# Patient Record
Sex: Female | Born: 1980
Health system: Southern US, Community
[De-identification: ages and names within clinical notes are randomized; demographics above are authoritative.]

## PROBLEM LIST (undated history)

## (undated) DIAGNOSIS — E119 Type 2 diabetes mellitus without complications: Secondary | ICD-10-CM

## (undated) DIAGNOSIS — J302 Other seasonal allergic rhinitis: Secondary | ICD-10-CM

## (undated) DIAGNOSIS — F419 Anxiety disorder, unspecified: Secondary | ICD-10-CM

## (undated) DIAGNOSIS — F32A Depression, unspecified: Secondary | ICD-10-CM

## (undated) HISTORY — PX: INGUINAL HERNIA REPAIR: SHX194

## (undated) HISTORY — PX: WISDOM TOOTH EXTRACTION: SHX21

---

## 2001-03-20 ENCOUNTER — Encounter: Admission: RE | Admit: 2001-03-20 | Discharge: 2001-06-18 | Payer: Self-pay | Admitting: Endocrinology

## 2007-08-24 ENCOUNTER — Inpatient Hospital Stay (HOSPITAL_COMMUNITY): Admission: AD | Admit: 2007-08-24 | Discharge: 2007-08-26 | Payer: Self-pay | Admitting: Obstetrics & Gynecology

## 2009-05-25 ENCOUNTER — Ambulatory Visit: Payer: Self-pay | Admitting: Family Medicine

## 2009-09-19 ENCOUNTER — Ambulatory Visit (HOSPITAL_COMMUNITY): Admission: RE | Admit: 2009-09-19 | Discharge: 2009-09-19 | Payer: Self-pay | Admitting: Obstetrics & Gynecology

## 2010-02-27 ENCOUNTER — Inpatient Hospital Stay (HOSPITAL_COMMUNITY): Admission: RE | Admit: 2010-02-27 | Discharge: 2010-03-01 | Payer: Self-pay | Admitting: Obstetrics & Gynecology

## 2010-06-23 LAB — CBC
HCT: 32.9 % — ABNORMAL LOW (ref 36.0–46.0)
HCT: 35.8 % — ABNORMAL LOW (ref 36.0–46.0)
Hemoglobin: 11.6 g/dL — ABNORMAL LOW (ref 12.0–15.0)
Hemoglobin: 12.6 g/dL (ref 12.0–15.0)
MCH: 32.1 pg (ref 26.0–34.0)
MCH: 32.5 pg (ref 26.0–34.0)
MCHC: 35.1 g/dL (ref 30.0–36.0)
MCHC: 35.2 g/dL (ref 30.0–36.0)
MCV: 91.4 fL (ref 78.0–100.0)
MCV: 92.3 fL (ref 78.0–100.0)
Platelets: 193 10*3/uL (ref 150–400)
Platelets: 213 10*3/uL (ref 150–400)
RBC: 3.56 MIL/uL — ABNORMAL LOW (ref 3.87–5.11)
RBC: 3.91 MIL/uL (ref 3.87–5.11)
RDW: 12.7 % (ref 11.5–15.5)
RDW: 12.8 % (ref 11.5–15.5)
WBC: 11 10*3/uL — ABNORMAL HIGH (ref 4.0–10.5)
WBC: 12.5 10*3/uL — ABNORMAL HIGH (ref 4.0–10.5)

## 2010-06-23 LAB — ABO/RH: ABO/RH(D): O POS

## 2010-06-23 LAB — RPR: RPR Ser Ql: NONREACTIVE

## 2010-06-23 LAB — GLUCOSE, RANDOM: Glucose, Bld: 109 mg/dL — ABNORMAL HIGH (ref 70–99)

## 2011-01-06 LAB — CBC
HCT: 34.7 — ABNORMAL LOW
Hemoglobin: 12
MCHC: 34.5
MCV: 91.1
Platelets: 214
RBC: 3.81 — ABNORMAL LOW
RDW: 13.3
WBC: 18.1 — ABNORMAL HIGH

## 2012-06-02 ENCOUNTER — Ambulatory Visit: Payer: Self-pay | Admitting: Surgery

## 2012-06-02 LAB — CBC WITH DIFFERENTIAL/PLATELET
Basophil #: 0.1 10*3/uL (ref 0.0–0.1)
Basophil %: 1.3 %
Eosinophil #: 0.4 10*3/uL (ref 0.0–0.7)
Eosinophil %: 6.9 %
HCT: 38.1 % (ref 35.0–47.0)
HGB: 12.9 g/dL (ref 12.0–16.0)
Lymphocyte #: 1.7 10*3/uL (ref 1.0–3.6)
Lymphocyte %: 27.4 %
MCH: 29.7 pg (ref 26.0–34.0)
MCHC: 34 g/dL (ref 32.0–36.0)
MCV: 87 fL (ref 80–100)
Monocyte #: 0.3 x10 3/mm (ref 0.2–0.9)
Monocyte %: 5 %
Neutrophil #: 3.7 10*3/uL (ref 1.4–6.5)
Neutrophil %: 59.4 %
Platelet: 221 10*3/uL (ref 150–440)
RBC: 4.36 10*6/uL (ref 3.80–5.20)
RDW: 12.3 % (ref 11.5–14.5)
WBC: 6.3 10*3/uL (ref 3.6–11.0)

## 2012-06-02 LAB — BASIC METABOLIC PANEL
Anion Gap: 5 — ABNORMAL LOW (ref 7–16)
BUN: 13 mg/dL (ref 7–18)
Calcium, Total: 9.1 mg/dL (ref 8.5–10.1)
Chloride: 105 mmol/L (ref 98–107)
Co2: 31 mmol/L (ref 21–32)
Creatinine: 1.01 mg/dL (ref 0.60–1.30)
EGFR (African American): >60
EGFR (Non-African Amer.): >60
Glucose: 118 mg/dL — ABNORMAL HIGH (ref 65–99)
Osmolality: 282 (ref 275–301)
Potassium: 3.7 mmol/L (ref 3.5–5.1)
Sodium: 141 mmol/L (ref 136–145)

## 2012-06-13 ENCOUNTER — Ambulatory Visit: Payer: Self-pay | Admitting: Surgery

## 2012-06-13 LAB — URINALYSIS, COMPLETE
Bacteria: NEGATIVE
Bilirubin,UR: NEGATIVE
Blood: NEGATIVE
Glucose,UR: NEGATIVE mg/dL (ref 0–75)
Ketone: NEGATIVE
Leukocyte Esterase: NEGATIVE
Nitrite: NEGATIVE
Ph: 7 (ref 4.5–8.0)
Protein: NEGATIVE
RBC,UR: NONE SEEN /HPF (ref 0–5)
Specific Gravity: 1.005 (ref 1.003–1.030)
WBC UR: NONE SEEN /HPF (ref 0–5)

## 2012-06-15 ENCOUNTER — Ambulatory Visit: Payer: Self-pay | Admitting: Surgery

## 2016-02-08 ENCOUNTER — Encounter: Payer: Self-pay | Admitting: Gynecology

## 2016-02-08 ENCOUNTER — Ambulatory Visit
Admission: EM | Admit: 2016-02-08 | Discharge: 2016-02-08 | Disposition: A | Discharge status: Home / Self Care | Payer: 59 | Attending: Family Medicine | Admitting: Family Medicine

## 2016-02-08 DIAGNOSIS — J209 Acute bronchitis, unspecified: Secondary | ICD-10-CM

## 2016-02-08 HISTORY — DX: Type 2 diabetes mellitus without complications: E11.9

## 2016-02-08 MED ORDER — BENZONATATE 100 MG PO CAPS: 100.0000 mg | ORAL_CAPSULE | Freq: Three times a day (TID) | ORAL | 0 refills | Status: DC

## 2016-02-08 MED ORDER — AZITHROMYCIN 250 MG PO TABS: ORAL_TABLET | ORAL | 0 refills | Status: DC

## 2016-02-08 MED ORDER — FEXOFENADINE-PSEUDOEPHED ER 180-240 MG PO TB24: 1.0000 | ORAL_TABLET | Freq: Every day | ORAL | 0 refills | Status: DC

## 2016-02-08 NOTE — ED Triage Notes (Signed)
Patient c/o coughing / chest hurts when coughing x over a week.

## 2016-02-08 NOTE — ED Provider Notes (Signed)
[facility]    CSN: [id number] Arrival date & time: 02/08/16  0801     History   Chief Complaint Chief Complaint  Patient presents with  . Cough    HPI Laura Browning is a 35 y.o. female.   Patient is a 35 year old white female history diabetes states that she is here because of chest congestion and chest tightness that seemed to be getting worse. Patient is not that forthcoming with information but finally able to ascertain that the cough and congestion is progressively getting worse sometimes cough is productive sometimes not sometimes it does have bronchospasm associated with her trying to clear the sputum. She does have some nasal congestion but she also has allergies as well. She does not smoke she denies staying with smokes around her. She is allergic to sulfa and Augmentin with anaphylaxis with sulfa medication. She drew a diabetic for over 20 years no pertinent family medical history pertaining to today's visit. She's had a hernia repair   The history is provided by the patient.  Cough  Cough characteristics:  Non-productive and productive Sputum characteristics:  Yellow Severity:  Moderate Duration:  7 days Timing:  Constant Progression:  Worsening Chronicity:  New Smoker: no   Context: upper respiratory infection and with activity   Context: not sick contacts and not smoke exposure   Relieved by:  Nothing Ineffective treatments:  None tried Associated symptoms: wheezing     Past Medical History:  Diagnosis Date  . Diabetes mellitus without complication (HCC)     There are no active problems to display for this patient.   Past Surgical History:  Procedure Laterality Date  . INGUINAL HERNIA REPAIR      OB History    No data available       Home Medications    Prior to Admission medications   Medication Sig Start Date End Date Taking? Authorizing Provider  citalopram (CELEXA) 20 MG tablet Take 15 mg by mouth daily.   Yes Historical  Provider, MD  fexofenadine (ALLEGRA) 180 MG tablet Take 180 mg by mouth daily.   Yes Historical Provider, MD  norethindrone-ethinyl estradiol-iron (ESTROSTEP FE,TILIA FE,TRI-LEGEST FE) 1-20/1-30/1-35 MG-MCG tablet Take 1 tablet by mouth daily.   Yes Historical Provider, MD  azithromycin (ZITHROMAX Z-PAK) 250 MG tablet Take 2 tablets first day and then 1 po a day for 4 days 02/08/16   Hassan RowanEugene Margaree Sandhu, MD  benzonatate (TESSALON) 100 MG capsule Take 1 capsule (100 mg total) by mouth every 8 (eight) hours. 02/08/16   Hassan RowanEugene Haven Pylant, MD  fexofenadine-pseudoephedrine (ALLEGRA-D ALLERGY & CONGESTION) 180-240 MG 24 hr tablet Take 1 tablet by mouth daily. 02/08/16   Hassan RowanEugene Ryder Chesmore, MD    Family History No family history on file.  Social History Social History  Substance Use Topics  . Smoking status: Never Smoker  . Smokeless tobacco: Never Used  . Alcohol use Yes     Allergies   Augmentin [amoxicillin-pot clavulanate] and Sulfa antibiotics   Review of Systems Review of Systems  Respiratory: Positive for cough, chest tightness and wheezing.   All other systems reviewed and are negative.    Physical Exam Triage Vital Signs ED Triage Vitals  Enc Vitals Group     BP 02/08/16 0822 107/69     Pulse Rate 02/08/16 0822 (!) 107     Resp 02/08/16 0822 16     Temp 02/08/16 0822 99.6 F (37.6 C)     Temp Source 02/08/16 0822 Oral  SpO2 02/08/16 0822 100 %     Weight 02/08/16 0823 128 lb (58.1 kg)     Height 02/08/16 0823 5\' 3"  (1.6 m)     Head Circumference --      Peak Flow --      Pain Score 02/08/16 0826 3     Pain Loc --      Pain Edu? --      Excl. in GC? --    No data found.   Updated Vital Signs BP 107/69 (BP Location: Left Arm)   Pulse (!) 107   Temp 99.6 F (37.6 C) (Oral)   Resp 16   Ht 5\' 3"  (1.6 m)   Wt 128 lb (58.1 kg)   SpO2 100%   BMI 22.67 kg/m   Visual Acuity Right Eye Distance:   Left Eye Distance:   Bilateral Distance:    Right Eye Near:   Left Eye Near:     Bilateral Near:     Physical Exam  Constitutional: She is oriented to person, place, and time. She appears well-developed and well-nourished.  HENT:  Head: Normocephalic and atraumatic.  Eyes: Pupils are equal, round, and reactive to light.  Neck: Normal range of motion.  Cardiovascular: Normal rate, regular rhythm and normal heart sounds.   Pulmonary/Chest: Effort normal.  Musculoskeletal: Normal range of motion.  Lymphadenopathy:    She has cervical adenopathy.  Neurological: She is alert and oriented to person, place, and time.  Skin: Skin is warm.  Psychiatric: She has a normal mood and affect.  Vitals reviewed.    UC Treatments / Results  Labs (all labs ordered are listed, but only abnormal results are displayed) Labs Reviewed - No data to display  EKG  EKG Interpretation None       Radiology No results found.  Procedures Procedures (including critical care time)  Medications Ordered in UC Medications - No data to display   Initial Impression / Assessment and Plan / UC Course  I have reviewed the triage vital signs and the nursing notes.  Pertinent labs & imaging results that were available during my care of the patient were reviewed by me and considered in my medical decision making (see chart for details).  Clinical Course   Because she is a diabetic we'll place on Z-Pak cough is harsh ascertain how this but will give her Jerilynn Somessalon Perles and even though she thinks the nasal congestion is mostly from allergies will give her Allegra-D 1 tablet daily. Patient declined work note. This structure follow-up PCP if not better in a week.  Final Clinical Impressions(s) / UC Diagnoses   Final diagnoses:  Acute bronchitis, unspecified organism    New Prescriptions New Prescriptions   AZITHROMYCIN (ZITHROMAX Z-PAK) 250 MG TABLET    Take 2 tablets first day and then 1 po a day for 4 days   BENZONATATE (TESSALON) 100 MG CAPSULE    Take 1 capsule (100 mg total)  by mouth every 8 (eight) hours.   FEXOFENADINE-PSEUDOEPHEDRINE (ALLEGRA-D ALLERGY & CONGESTION) 180-240 MG 24 HR TABLET    Take 1 tablet by mouth daily.     Hassan RowanEugene Inna Tisdell, MD 02/08/16 [phone]

## 2016-06-25 DIAGNOSIS — G5603 Carpal tunnel syndrome, bilateral upper limbs: Secondary | ICD-10-CM | POA: Diagnosis not present

## 2016-06-30 DIAGNOSIS — E108 Type 1 diabetes mellitus with unspecified complications: Secondary | ICD-10-CM | POA: Diagnosis not present

## 2016-07-29 DIAGNOSIS — G5602 Carpal tunnel syndrome, left upper limb: Secondary | ICD-10-CM | POA: Diagnosis not present

## 2016-07-29 DIAGNOSIS — G5601 Carpal tunnel syndrome, right upper limb: Secondary | ICD-10-CM | POA: Diagnosis not present

## 2016-08-02 DIAGNOSIS — G5601 Carpal tunnel syndrome, right upper limb: Secondary | ICD-10-CM | POA: Diagnosis not present

## 2016-08-04 DIAGNOSIS — Z1389 Encounter for screening for other disorder: Secondary | ICD-10-CM | POA: Diagnosis not present

## 2016-08-04 DIAGNOSIS — E109 Type 1 diabetes mellitus without complications: Secondary | ICD-10-CM | POA: Diagnosis not present

## 2016-09-09 DIAGNOSIS — E108 Type 1 diabetes mellitus with unspecified complications: Secondary | ICD-10-CM | POA: Diagnosis not present

## 2016-09-20 DIAGNOSIS — E108 Type 1 diabetes mellitus with unspecified complications: Secondary | ICD-10-CM | POA: Diagnosis not present

## 2016-10-21 ENCOUNTER — Encounter: Payer: Self-pay | Admitting: Obstetrics & Gynecology

## 2016-10-21 ENCOUNTER — Ambulatory Visit (INDEPENDENT_AMBULATORY_CARE_PROVIDER_SITE_OTHER): Payer: 59 | Admitting: Obstetrics & Gynecology

## 2016-10-21 VITALS — BP 120/74 | Ht 62.5 in | Wt 139.0 lb

## 2016-10-21 DIAGNOSIS — Z01419 Encounter for gynecological examination (general) (routine) without abnormal findings: Secondary | ICD-10-CM | POA: Diagnosis not present

## 2016-10-21 DIAGNOSIS — Z3041 Encounter for surveillance of contraceptive pills: Secondary | ICD-10-CM

## 2016-10-21 MED ORDER — NORETHINDRON-ETHINYL ESTRAD-FE 1-20/1-30/1-35 MG-MCG PO TABS: 1.0000 | ORAL_TABLET | Freq: Every day | ORAL | 4 refills | Status: DC

## 2016-10-21 NOTE — Progress Notes (Signed)
Laura Browning 1980/04/21 [id number]   History:    36 y.o. G2P2  Married.  Pharmacist.  Daughter is 189 yo, son is 346 1/37 yo, doing very well.  Had 2 foster children x 8 months.  RP:  Established patient presenting for annual gyn exam   HPI:  Well on Estrostep.  No pelvic pain, no abnormal bleeding.  Normal vaginal secretions.  Breasts wnl.  Mictions/BMs wnl.  IDDM well controled, followed by Dr Evlyn [facility] who does Health Lab panel every year.  Past medical history,surgical history, family history and social history were all reviewed and documented in the EPIC chart.  Gynecologic History No LMP recorded. Patient is not currently having periods (Reason: Oral contraceptives). Contraception: OCP (estrogen/progesterone) Last Pap: 08/2015. Results were: normal Last mammogram: Never  Obstetric History OB History  Gravida Para Term Preterm AB Living  2 2       2   SAB TAB Ectopic Multiple Live Births               # Outcome Date GA Lbr Len/2nd Weight Sex Delivery Anes PTL Lv  2 Para           1 Para                ROS: A ROS was performed and pertinent positives and negatives are included in the history.  GENERAL: No fevers or chills. HEENT: No change in vision, no earache, sore throat or sinus congestion. NECK: No pain or stiffness. CARDIOVASCULAR: No chest pain or pressure. No palpitations. PULMONARY: No shortness of breath, cough or wheeze. GASTROINTESTINAL: No abdominal pain, nausea, vomiting or diarrhea, melena or bright red blood per rectum. GENITOURINARY: No urinary frequency, urgency, hesitancy or dysuria. MUSCULOSKELETAL: No joint or muscle pain, no back pain, no recent trauma. DERMATOLOGIC: No rash, no itching, no lesions. ENDOCRINE: No polyuria, polydipsia, no heat or cold intolerance. No recent change in weight. HEMATOLOGICAL: No anemia or easy bruising or bleeding. NEUROLOGIC: No headache, seizures, numbness, tingling or weakness. PSYCHIATRIC: No depression, no loss of interest in  normal activity or change in sleep pattern.     Exam:   BP 120/74   Ht 5' 2.5" (1.588 m)   Wt 139 lb (63 kg)   BMI 25.02 kg/m   Body mass index is 25.02 kg/m.  General appearance : Well developed well nourished female. No acute distress HEENT: Eyes: no retinal hemorrhage or exudates,  Neck supple, trachea midline, no carotid bruits, no thyroidmegaly Lungs: Clear to auscultation, no rhonchi or wheezes, or rib retractions  Heart: Regular rate and rhythm, no murmurs or gallops Breast:Examined in sitting and supine position were symmetrical in appearance, no palpable masses or tenderness,  no skin retraction, no nipple inversion, no nipple discharge, no skin discoloration, no axillary or supraclavicular lymphadenopathy Abdomen: no palpable masses or tenderness, no rebound or guarding Extremities: no edema or skin discoloration or tenderness  Pelvic:  Bartholin, Urethra, Skene Glands: Within normal limits             Vagina: No gross lesions or discharge  Cervix: No gross lesions or discharge  Uterus  AV, normal size, shape and consistency, non-tender and mobile  Adnexa  Without masses or tenderness  Anus and perineum  normal    Assessment/Plan:  36 y.o. female for annual exam   1. Well female exam with routine gynecological exam Normal gyn exam.  Pap normal/HPV HR neg 08/2015.  Breasts wnl.  2. Encounter for surveillance of contraceptive  pills Estrostep represcribed.  Genia Del MD, 2:19 PM 10/21/2016

## 2016-10-21 NOTE — Patient Instructions (Signed)
1. Well female exam with routine gynecological exam Normal gyn exam.  Pap normal/HPV HR neg 08/2015.  Breasts wnl.  2. Encounter for surveillance of contraceptive pills Estrostep represcribed.  Laura Browning, it was a pleasure to see you today!  Health Maintenance, Female Adopting a healthy lifestyle and getting preventive care can go a long way to promote health and wellness. Talk with your health care provider about what schedule of regular examinations is right for you. This is a good chance for you to check in with your provider about disease prevention and staying healthy. In between checkups, there are plenty of things you can do on your own. Experts have done a lot of research about which lifestyle changes and preventive measures are most likely to keep you healthy. Ask your health care provider for more information. Weight and diet Eat a healthy diet  Be sure to include plenty of vegetables, fruits, low-fat dairy products, and lean protein.  Do not eat a lot of foods high in solid fats, added sugars, or salt.  Get regular exercise. This is one of the most important things you can do for your health. ? Most adults should exercise for at least 150 minutes each week. The exercise should increase your heart rate and make you sweat (moderate-intensity exercise). ? Most adults should also do strengthening exercises at least twice a week. This is in addition to the moderate-intensity exercise.  Maintain a healthy weight  Body mass index (BMI) is a measurement that can be used to identify possible weight problems. It estimates body fat based on height and weight. Your health care provider can help determine your BMI and help you achieve or maintain a healthy weight.  For females 19 years of age and older: ? A BMI below 18.5 is considered underweight. ? A BMI of 18.5 to 24.9 is normal. ? A BMI of 25 to 29.9 is considered overweight. ? A BMI of 30 and above is considered obese.  Watch levels  of cholesterol and blood lipids  You should start having your blood tested for lipids and cholesterol at 37 years of age, then have this test every 5 years.  You may need to have your cholesterol levels checked more often if: ? Your lipid or cholesterol levels are high. ? You are older than 37 years of age. ? You are at high risk for heart disease.  Cancer screening Lung Cancer  Lung cancer screening is recommended for adults 41-91 years old who are at high risk for lung cancer because of a history of smoking.  A yearly low-dose CT scan of the lungs is recommended for people who: ? Currently smoke. ? Have quit within the past 15 years. ? Have at least a 30-pack-year history of smoking. A pack year is smoking an average of one pack of cigarettes a day for 1 year.  Yearly screening should continue until it has been 15 years since you quit.  Yearly screening should stop if you develop a health problem that would prevent you from having lung cancer treatment.  Breast Cancer  Practice breast self-awareness. This means understanding how your breasts normally appear and feel.  It also means doing regular breast self-exams. Let your health care provider know about any changes, no matter how small.  If you are in your 20s or 30s, you should have a clinical breast exam (CBE) by a health care provider every 1-3 years as part of a regular health exam.  If you are 40 or  older, have a CBE every year. Also consider having a breast X-ray (mammogram) every year.  If you have a family history of breast cancer, talk to your health care provider about genetic screening.  If you are at high risk for breast cancer, talk to your health care provider about having an MRI and a mammogram every year.  Breast cancer gene (BRCA) assessment is recommended for women who have family members with BRCA-related cancers. BRCA-related cancers include: ? Breast. ? Ovarian. ? Tubal. ? Peritoneal  cancers.  Results of the assessment will determine the need for genetic counseling and BRCA1 and BRCA2 testing.  Cervical Cancer Your health care provider may recommend that you be screened regularly for cancer of the pelvic organs (ovaries, uterus, and vagina). This screening involves a pelvic examination, including checking for microscopic changes to the surface of your cervix (Pap test). You may be encouraged to have this screening done every 3 years, beginning at age 83.  For women ages 57-65, health care providers may recommend pelvic exams and Pap testing every 3 years, or they may recommend the Pap and pelvic exam, combined with testing for human papilloma virus (HPV), every 5 years. Some types of HPV increase your risk of cervical cancer. Testing for HPV may also be done on women of any age with unclear Pap test results.  Other health care providers may not recommend any screening for nonpregnant women who are considered low risk for pelvic cancer and who do not have symptoms. Ask your health care provider if a screening pelvic exam is right for you.  If you have had past treatment for cervical cancer or a condition that could lead to cancer, you need Pap tests and screening for cancer for at least 20 years after your treatment. If Pap tests have been discontinued, your risk factors (such as having a new sexual partner) need to be reassessed to determine if screening should resume. Some women have medical problems that increase the chance of getting cervical cancer. In these cases, your health care provider may recommend more frequent screening and Pap tests.  Colorectal Cancer  This type of cancer can be detected and often prevented.  Routine colorectal cancer screening usually begins at 37 years of age and continues through 37 years of age.  Your health care provider may recommend screening at an earlier age if you have risk factors for colon cancer.  Your health care provider may also  recommend using home test kits to check for hidden blood in the stool.  A small camera at the end of a tube can be used to examine your colon directly (sigmoidoscopy or colonoscopy). This is done to check for the earliest forms of colorectal cancer.  Routine screening usually begins at age 43.  Direct examination of the colon should be repeated every 5-10 years through 37 years of age. However, you may need to be screened more often if early forms of precancerous polyps or small growths are found.  Skin Cancer  Check your skin from head to toe regularly.  Tell your health care provider about any new moles or changes in moles, especially if there is a change in a mole's shape or color.  Also tell your health care provider if you have a mole that is larger than the size of a pencil eraser.  Always use sunscreen. Apply sunscreen liberally and repeatedly throughout the day.  Protect yourself by wearing long sleeves, pants, a wide-brimmed hat, and sunglasses whenever you are outside.  Heart disease, diabetes, and high blood pressure  High blood pressure causes heart disease and increases the risk of stroke. High blood pressure is more likely to develop in: ? People who have blood pressure in the high end of the normal range (130-139/85-89 mm Hg). ? People who are overweight or obese. ? People who are African American.  If you are 70-78 years of age, have your blood pressure checked every 3-5 years. If you are 24 years of age or older, have your blood pressure checked every year. You should have your blood pressure measured twice-once when you are at a hospital or clinic, and once when you are not at a hospital or clinic. Record the average of the two measurements. To check your blood pressure when you are not at a hospital or clinic, you can use: ? An automated blood pressure machine at a pharmacy. ? A home blood pressure monitor.  If you are between 77 years and 5 years old, ask your  health care provider if you should take aspirin to prevent strokes.  Have regular diabetes screenings. This involves taking a blood sample to check your fasting blood sugar level. ? If you are at a normal weight and have a low risk for diabetes, have this test once every three years after 37 years of age. ? If you are overweight and have a high risk for diabetes, consider being tested at a younger age or more often. Preventing infection Hepatitis B  If you have a higher risk for hepatitis B, you should be screened for this virus. You are considered at high risk for hepatitis B if: ? You were born in a country where hepatitis B is common. Ask your health care provider which countries are considered high risk. ? Your parents were born in a high-risk country, and you have not been immunized against hepatitis B (hepatitis B vaccine). ? You have HIV or AIDS. ? You use needles to inject street drugs. ? You live with someone who has hepatitis B. ? You have had sex with someone who has hepatitis B. ? You get hemodialysis treatment. ? You take certain medicines for conditions, including cancer, organ transplantation, and autoimmune conditions.  Hepatitis C  Blood testing is recommended for: ? Everyone born from [birth year] through 1966. ? Anyone with known risk factors for hepatitis C.  Sexually transmitted infections (STIs)  You should be screened for sexually transmitted infections (STIs) including gonorrhea and chlamydia if: ? You are sexually active and are younger than 37 years of age. ? You are older than 37 years of age and your health care provider tells you that you are at risk for this type of infection. ? Your sexual activity has changed since you were last screened and you are at an increased risk for chlamydia or gonorrhea. Ask your health care provider if you are at risk.  If you do not have HIV, but are at risk, it may be recommended that you take a prescription medicine daily to  prevent HIV infection. This is called pre-exposure prophylaxis (PrEP). You are considered at risk if: ? You are sexually active and do not regularly use condoms or know the HIV status of your partner(s). ? You take drugs by injection. ? You are sexually active with a partner who has HIV.  Talk with your health care provider about whether you are at high risk of being infected with HIV. If you choose to begin PrEP, you should first be tested for HIV.  You should then be tested every 3 months for as long as you are taking PrEP. Pregnancy  If you are premenopausal and you may become pregnant, ask your health care provider about preconception counseling.  If you may become pregnant, take 400 to 800 micrograms (mcg) of folic acid every day.  If you want to prevent pregnancy, talk to your health care provider about birth control (contraception). Osteoporosis and menopause  Osteoporosis is a disease in which the bones lose minerals and strength with aging. This can result in serious bone fractures. Your risk for osteoporosis can be identified using a bone density scan.  If you are 17 years of age or older, or if you are at risk for osteoporosis and fractures, ask your health care provider if you should be screened.  Ask your health care provider whether you should take a calcium or vitamin D supplement to lower your risk for osteoporosis.  Menopause may have certain physical symptoms and risks.  Hormone replacement therapy may reduce some of these symptoms and risks. Talk to your health care provider about whether hormone replacement therapy is right for you. Follow these instructions at home:  Schedule regular health, dental, and eye exams.  Stay current with your immunizations.  Do not use any tobacco products including cigarettes, chewing tobacco, or electronic cigarettes.  If you are pregnant, do not drink alcohol.  If you are breastfeeding, limit how much and how often you drink  alcohol.  Limit alcohol intake to no more than 1 drink per day for nonpregnant women. One drink equals 12 ounces of beer, 5 ounces of wine, or 1 ounces of hard liquor.  Do not use street drugs.  Do not share needles.  Ask your health care provider for help if you need support or information about quitting drugs.  Tell your health care provider if you often feel depressed.  Tell your health care provider if you have ever been abused or do not feel safe at home. This information is not intended to replace advice given to you by your health care provider. Make sure you discuss any questions you have with your health care provider. Document Released: 10/12/2010 Document Revised: 09/04/2015 Document Reviewed: 12/31/2014 Elsevier Interactive Patient Education  2018 [employer].  Kegel Exercises Kegel exercises help strengthen the muscles that support the rectum, vagina, small intestine, bladder, and uterus. Doing Kegel exercises can help:  Improve bladder and bowel control.  Improve sexual response.  Reduce problems and discomfort during pregnancy.  Kegel exercises involve squeezing your pelvic floor muscles, which are the same muscles you squeeze when you try to stop the flow of urine. The exercises can be done while sitting, standing, or lying down, but it is best to vary your position. Phase 1 exercises 1. Squeeze your pelvic floor muscles tight. You should feel a tight lift in your rectal area. If you are a female, you should also feel a tightness in your vaginal area. Keep your stomach, buttocks, and legs relaxed. 2. Hold the muscles tight for up to 10 seconds. 3. Relax your muscles. Repeat this exercise 50 times a day or as many times as told by your health care provider. Continue to do this exercise for at least 4-6 weeks or for as long as told by your health care provider. This information is not intended to replace advice given to you by your health care provider. Make sure you  discuss any questions you have with your health care provider. Document Released: 03/15/2012  Document Revised: 11/22/2015 Document Reviewed: 02/16/2015 Elsevier Interactive Patient Education  [employer].

## 2016-11-18 ENCOUNTER — Encounter: Payer: Self-pay | Admitting: Emergency Medicine

## 2016-11-18 ENCOUNTER — Ambulatory Visit
Admission: EM | Admit: 2016-11-18 | Discharge: 2016-11-18 | Disposition: A | Discharge status: Home / Self Care | Payer: BLUE CROSS/BLUE SHIELD | Attending: Family Medicine | Admitting: Family Medicine

## 2016-11-18 DIAGNOSIS — L03211 Cellulitis of face: Secondary | ICD-10-CM

## 2016-11-18 DIAGNOSIS — R220 Localized swelling, mass and lump, head: Secondary | ICD-10-CM

## 2016-11-18 MED ORDER — MUPIROCIN 2 % EX OINT: 1.0000 "application " | TOPICAL_OINTMENT | Freq: Two times a day (BID) | CUTANEOUS | 0 refills | Status: DC

## 2016-11-18 MED ORDER — CEFTRIAXONE SODIUM 1 G IJ SOLR
1.0000 g | Freq: Once | INTRAMUSCULAR | Status: AC
  Administered 2016-11-18: 1 g via INTRAMUSCULAR

## 2016-11-18 MED ORDER — DOXYCYCLINE HYCLATE 100 MG PO CAPS: 100.0000 mg | ORAL_CAPSULE | Freq: Two times a day (BID) | ORAL | 0 refills | Status: DC

## 2016-11-18 NOTE — ED Provider Notes (Signed)
[facility]    CSN: [id number] Arrival date & time: 11/18/16  1400     History   Chief Complaint Chief Complaint  Patient presents with  . Facial Swelling    HPI Laura Browning is a 36 y.o. female.   Patient is a 36 year old white female who presents with swelling of left side of her face. She states 2 of her children had what sounds like staph infections of the extremities and both had to go on Septra DS for resolution of the symptoms and Bactroban ointment. She is wondering whether she may have picked up on her fingers staph infection because she had a pimple on the left side of her face she scratched her face and now she's diagnosis swelling in the face. The pimple first. Last week but then all of a sudden the swelling started yesterday morning and by yesterday evening it was a lot worse. She is coming today now with the swelling of the left side of her face very pronounced. She does states this feels 6 on the surface of the skin and not inside the mouth. She mistook a needle to try to open up the lesion is on the outside of her left jaw was unable to get anything to drain. She does not smoke she is a diabetes type 1 and she has a pump. She's had inguinal hernia repair surgery. Diabetes runs in the family as well as hypertension and family members had a stroke. She is allergic to Augmentin and sulfa    The history is provided by the patient. No language interpreter was used.  Abscess  Location:  Face Facial abscess location:  Face and L cheek Abscess quality: induration, painful, redness and warmth   Abscess quality: no fluctuance and not weeping   Duration:  2 days Progression:  Worsening Pain details:    Quality:  Pressure, throbbing and sharp   Severity:  Moderate   Timing:  Constant   Progression:  Worsening Chronicity:  New Relieved by:  Nothing Worsened by:  Nothing Ineffective treatments:  None tried   Past Medical History:  Diagnosis Date  . Diabetes  mellitus without complication (HCC)     There are no active problems to display for this patient.   Past Surgical History:  Procedure Laterality Date  . INGUINAL HERNIA REPAIR    . WISDOM TOOTH EXTRACTION      OB History    Gravida Para Term Preterm AB Living   2 2       2    SAB TAB Ectopic Multiple Live Births                   Home Medications    Prior to Admission medications   Medication Sig Start Date End Date Taking? Authorizing Provider  insulin glulisine (APIDRA) 100 UNIT/ML injection Inject into the skin 3 (three) times daily before meals.   Yes [provider]  citalopram (CELEXA) 20 MG tablet Take 15 mg by mouth daily.    [provider]  doxycycline (VIBRAMYCIN) 100 MG capsule Take 1 capsule (100 mg total) by mouth 2 (two) times daily. 11/18/16   Hassan Rowan, MD  fexofenadine (ALLEGRA) 180 MG tablet Take 180 mg by mouth daily.    [provider]  mupirocin ointment (BACTROBAN) 2 % Apply 1 application topically 2 (two) times daily. 11/18/16   Hassan Rowan, MD  norethindrone-ethinyl estradiol-iron (ESTROSTEP FE,TILIA FE,TRI-LEGEST FE) 1-20/1-30/1-35 MG-MCG tablet Take 1 tablet by mouth daily.  10/21/16   Genia Del, MD    Family History Family History  Problem Relation Age of Onset  . Diabetes Maternal Uncle   . Cancer Maternal Grandmother        melanoma   . Hypertension Maternal Grandmother   . Stroke Paternal Grandmother     Social History Social History  Substance Use Topics  . Smoking status: Never Smoker  . Smokeless tobacco: Never Used  . Alcohol use Yes     Comment: OCC WINE     Allergies   Augmentin [amoxicillin-pot clavulanate] and Sulfa antibiotics   Review of Systems Review of Systems  HENT: Positive for facial swelling and mouth sores.   All other systems reviewed and are negative.    Physical Exam Triage Vital Signs ED Triage Vitals  Enc Vitals Group     BP 11/18/16 1445 118/75     Pulse Rate  11/18/16 1445 77     Resp 11/18/16 1445 16     Temp 11/18/16 1445 98.7 F (37.1 C)     Temp Source 11/18/16 1445 Oral     SpO2 11/18/16 1445 100 %     Weight 11/18/16 1441 140 lb (63.5 kg)     Height 11/18/16 1441 5\' 3"  (1.6 m)     Head Circumference --      Peak Flow --      Pain Score 11/18/16 1441 2     Pain Loc --      Pain Edu? --      Excl. in GC? --    No data found.   Updated Vital Signs BP 118/75 (BP Location: Left Arm)   Pulse 77   Temp 98.7 F (37.1 C) (Oral)   Resp 16   Ht 5\' 3"  (1.6 m)   Wt 140 lb (63.5 kg)   LMP 11/16/2016 (Exact Date)   SpO2 100%   BMI 24.80 kg/m   Visual Acuity Right Eye Distance:   Left Eye Distance:   Bilateral Distance:    Right Eye Near:   Left Eye Near:    Bilateral Near:     Physical Exam  Constitutional: She is oriented to person, place, and time. She appears well-developed and well-nourished.  HENT:  Head: Normocephalic.    Right Ear: Hearing, tympanic membrane, external ear and ear canal normal.  Left Ear: Hearing, tympanic membrane, external ear and ear canal normal.  Nose: Nose normal.  Mouth/Throat: Uvula is midline and oropharynx is clear and moist.  She has a small ulceration near the mandible on the left side of her face swelling around the mandible where the abscess cellulitis has grown in spread. The abscess is not fluctuant does not would not be amiable to I&D at this time  Eyes: Pupils are equal, round, and reactive to light.  Neck: Normal range of motion. Neck supple.  Pulmonary/Chest: Effort normal.  Musculoskeletal: Normal range of motion.  Neurological: She is alert and oriented to person, place, and time.  Skin: There is erythema.  Psychiatric: She has a normal mood and affect.  Vitals reviewed.    UC Treatments / Results  Labs (all labs ordered are listed, but only abnormal results are displayed) Labs Reviewed - No data to display  EKG  EKG Interpretation None       Radiology No  results found.  Procedures Procedures (including critical care time)  Medications Ordered in UC Medications  cefTRIAXone (ROCEPHIN) injection 1 g (not administered)     Initial Impression /  Assessment and Plan / UC Course  I have reviewed the triage vital signs and the nursing notes.  Pertinent labs & imaging results that were available during my care of the patient were reviewed by me and considered in my medical decision making (see chart for details).   were going to give her a gram of Rocephin IM place her on doxycycline since she's allergic to Septra and Bactroban ointment twice a day recommended that she's not better in the next 2448 hrs. to go to the ED of her choice she may require IV vancomycin may need to have a ENT consult as well. We'll try to treat this aggressively since she is a diabetic  Final Clinical Impressions(s) / UC Diagnoses   Final diagnoses:  Facial swelling  Facial cellulitis    New Prescriptions New Prescriptions   DOXYCYCLINE (VIBRAMYCIN) 100 MG CAPSULE    Take 1 capsule (100 mg total) by mouth 2 (two) times daily.   MUPIROCIN OINTMENT (BACTROBAN) 2 %    Apply 1 application topically 2 (two) times daily.   Note: This dictation was prepared with Dragon dictation along with smaller phrase technology. Any transcriptional errors that result from this process are unintentional.  Controlled Substance Prescriptions  Controlled Substance Registry consulted? Not Applicable   Hassan RowanWade, Ernestene Coover, MD 11/18/16 [phone]

## 2016-11-18 NOTE — ED Triage Notes (Signed)
Patient c/o left sided facial swelling that started 2-3 days ago.  Patient reports a small bump on the left side of her face a week ago.

## 2016-11-18 NOTE — ED Notes (Signed)
Patient shows no signs of adverse reaction to medication at this time.  

## 2016-12-08 DIAGNOSIS — Z8679 Personal history of other diseases of the circulatory system: Secondary | ICD-10-CM | POA: Diagnosis not present

## 2016-12-08 DIAGNOSIS — L80 Vitiligo: Secondary | ICD-10-CM | POA: Diagnosis not present

## 2016-12-08 DIAGNOSIS — Z23 Encounter for immunization: Secondary | ICD-10-CM | POA: Diagnosis not present

## 2016-12-08 DIAGNOSIS — E310 Autoimmune polyglandular failure: Secondary | ICD-10-CM | POA: Diagnosis not present

## 2016-12-08 DIAGNOSIS — E109 Type 1 diabetes mellitus without complications: Secondary | ICD-10-CM | POA: Diagnosis not present

## 2016-12-08 DIAGNOSIS — Z1389 Encounter for screening for other disorder: Secondary | ICD-10-CM | POA: Diagnosis not present

## 2016-12-16 DIAGNOSIS — E310 Autoimmune polyglandular failure: Secondary | ICD-10-CM | POA: Diagnosis not present

## 2016-12-16 DIAGNOSIS — E108 Type 1 diabetes mellitus with unspecified complications: Secondary | ICD-10-CM | POA: Diagnosis not present

## 2016-12-24 DIAGNOSIS — H5213 Myopia, bilateral: Secondary | ICD-10-CM | POA: Diagnosis not present

## 2017-01-20 DIAGNOSIS — E108 Type 1 diabetes mellitus with unspecified complications: Secondary | ICD-10-CM | POA: Diagnosis not present

## 2017-03-23 DIAGNOSIS — E109 Type 1 diabetes mellitus without complications: Secondary | ICD-10-CM | POA: Diagnosis not present

## 2017-04-22 DIAGNOSIS — E310 Autoimmune polyglandular failure: Secondary | ICD-10-CM | POA: Diagnosis not present

## 2017-04-22 DIAGNOSIS — E109 Type 1 diabetes mellitus without complications: Secondary | ICD-10-CM | POA: Diagnosis not present

## 2017-04-22 DIAGNOSIS — L80 Vitiligo: Secondary | ICD-10-CM | POA: Diagnosis not present

## 2017-04-22 DIAGNOSIS — Z8679 Personal history of other diseases of the circulatory system: Secondary | ICD-10-CM | POA: Diagnosis not present

## 2017-05-09 DIAGNOSIS — E108 Type 1 diabetes mellitus with unspecified complications: Secondary | ICD-10-CM | POA: Diagnosis not present

## 2017-08-12 DIAGNOSIS — E109 Type 1 diabetes mellitus without complications: Secondary | ICD-10-CM | POA: Diagnosis not present

## 2017-08-12 DIAGNOSIS — E108 Type 1 diabetes mellitus with unspecified complications: Secondary | ICD-10-CM | POA: Diagnosis not present

## 2017-08-15 DIAGNOSIS — Z8679 Personal history of other diseases of the circulatory system: Secondary | ICD-10-CM | POA: Diagnosis not present

## 2017-08-15 DIAGNOSIS — L80 Vitiligo: Secondary | ICD-10-CM | POA: Diagnosis not present

## 2017-08-15 DIAGNOSIS — E310 Autoimmune polyglandular failure: Secondary | ICD-10-CM | POA: Diagnosis not present

## 2017-08-15 DIAGNOSIS — E109 Type 1 diabetes mellitus without complications: Secondary | ICD-10-CM | POA: Diagnosis not present

## 2017-10-28 ENCOUNTER — Other Ambulatory Visit: Payer: Self-pay | Admitting: *Deleted

## 2017-10-28 MED ORDER — NORETHINDRON-ETHINYL ESTRAD-FE 1-20/1-30/1-35 MG-MCG PO TABS: 1.0000 | ORAL_TABLET | Freq: Every day | ORAL | 0 refills | Status: DC

## 2017-10-28 NOTE — Telephone Encounter (Signed)
Annual scheduled on 12/02/17

## 2017-12-02 ENCOUNTER — Encounter: Payer: Self-pay | Admitting: Obstetrics & Gynecology

## 2017-12-02 ENCOUNTER — Ambulatory Visit (INDEPENDENT_AMBULATORY_CARE_PROVIDER_SITE_OTHER): Payer: BLUE CROSS/BLUE SHIELD | Admitting: Obstetrics & Gynecology

## 2017-12-02 VITALS — BP 110/70 | Ht 63.5 in | Wt 144.8 lb

## 2017-12-02 DIAGNOSIS — Z01419 Encounter for gynecological examination (general) (routine) without abnormal findings: Secondary | ICD-10-CM

## 2017-12-02 DIAGNOSIS — Z794 Long term (current) use of insulin: Secondary | ICD-10-CM

## 2017-12-02 DIAGNOSIS — Z3041 Encounter for surveillance of contraceptive pills: Secondary | ICD-10-CM

## 2017-12-02 DIAGNOSIS — IMO0001 Reserved for inherently not codable concepts without codable children: Secondary | ICD-10-CM

## 2017-12-02 DIAGNOSIS — E119 Type 2 diabetes mellitus without complications: Secondary | ICD-10-CM

## 2017-12-02 MED ORDER — NORETHIN ACE-ETH ESTRAD-FE 1-20 MG-MCG PO TABS: 1.0000 | ORAL_TABLET | Freq: Every day | ORAL | 4 refills | Status: DC

## 2017-12-02 NOTE — Progress Notes (Signed)
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  Family enjoys camping.  RP:  Established patient presenting for annual gyn exam   HPI: Well on the generic of Loestrin FE 1/20.  No breakthrough bleeding.  No pelvic pain.  No pain with intercourse.  Urine and bowel movements normal.  Breasts normal.  Body mass index 25.25.  Physical activities regularly.  Healthy nutrition.  IDDM well-controlled followed by Dr. Evlyn [place].  Health labs with Dr. Evlyn [place].  No family history of breast cancer or other gynecologic cancer.  Past medical history,surgical history, family history and social history were all reviewed and documented in the EPIC chart.  Gynecologic History Patient's last menstrual period was 11/10/2017. Contraception: OCP (estrogen/progesterone) Last Pap: 2017. Results were: normal Last mammogram: Never Bone Density: Never Colonoscopy: Never  Obstetric History OB History  Gravida Para Term Preterm AB Living  2 2       2   SAB TAB Ectopic Multiple Live Births               # Outcome Date GA Lbr Len/2nd Weight Sex Delivery Anes PTL Lv  2 Para           1 Para              ROS: A ROS was performed and pertinent positives and negatives are included in the history.  GENERAL: No fevers or chills. HEENT: No change in vision, no earache, sore throat or sinus congestion. NECK: No pain or stiffness. CARDIOVASCULAR: No chest pain or pressure. No palpitations. PULMONARY: No shortness of breath, cough or wheeze. GASTROINTESTINAL: No abdominal pain, nausea, vomiting or diarrhea, melena or bright red blood per rectum. GENITOURINARY: No urinary frequency, urgency, hesitancy or dysuria. MUSCULOSKELETAL: No joint or muscle pain, no back pain, no recent trauma. DERMATOLOGIC: No rash, no itching, no lesions. ENDOCRINE: No polyuria, polydipsia, no heat or cold intolerance. No recent change in weight. HEMATOLOGICAL: No anemia or easy  bruising or bleeding. NEUROLOGIC: No headache, seizures, numbness, tingling or weakness. PSYCHIATRIC: No depression, no loss of interest in normal activity or change in sleep pattern.     Exam:   BP 110/70   Ht 5' 3.5" (1.613 m)   Wt 144 lb 12.8 oz (65.7 kg)   LMP 11/10/2017 Comment: pill  BMI 25.25 kg/m   Body mass index is 25.25 kg/m.  General appearance : Well developed well nourished female. No acute distress HEENT: Eyes: no retinal hemorrhage or exudates,  Neck supple, trachea midline, no carotid bruits, no thyroidmegaly Lungs: Clear to auscultation, no rhonchi or wheezes, or rib retractions  Heart: Regular rate and rhythm, no murmurs or gallops Breast:Examined in sitting and supine position were symmetrical in appearance, no palpable masses or tenderness,  no skin retraction, no nipple inversion, no nipple discharge, no skin discoloration, no axillary or supraclavicular lymphadenopathy Abdomen: no palpable masses or tenderness, no rebound or guarding Extremities: no edema or skin discoloration or tenderness  Pelvic: Vulva: Normal             Vagina: No gross lesions or discharge  Cervix: No gross lesions or discharge.  Pap reflex done.  Uterus  AV, normal size, shape and consistency, non-tender and mobile  Adnexa  Without masses or tenderness  Anus: Normal   Assessment/Plan:  37 y.o. female for annual exam   1. Encounter for routine gynecological examination with Papanicolaou smear  of cervix Normal gynecologic exam.  Pap reflex done.  Breast exam normal.  Health labs with Dr. Evlyn Kanner.  IDDM well controlled and followed by Dr. Evlyn Kanner.  Body mass index 25.25.  Continue with regular physical activity and healthy nutrition.  2. Encounter for surveillance of contraceptive pills Well on the generic of Loestrin FE 1/20.  No contraindication to birth control pill.  Continue with the same birth control pill, prescription sent to pharmacy.  3. Diabetes mellitus, insulin dependent  (IDDM), controlled (HCC) Well-controlled.  Followed by Dr. Evlyn Kanner.  Other orders - norethindrone-ethinyl estradiol (LOESTRIN FE 1/20) 1-20 MG-MCG tablet; Take 1 tablet by mouth daily.  Genia Del MD, 12:43 PM 12/02/2017

## 2017-12-02 NOTE — Patient Instructions (Signed)
1. Encounter for routine gynecological examination with Papanicolaou smear of cervix Normal gynecologic exam.  Pap reflex done.  Breast exam normal.  Health labs with Dr. Evlyn [place].  IDDM well controlled and followed by Dr. Evlyn [place].  Body mass index 25.25.  Continue with regular physical activity and healthy nutrition.  2. Encounter for surveillance of contraceptive pills Well on the generic of Loestrin FE 1/20.  No contraindication to birth control pill.  Continue with the same birth control pill, prescription sent to pharmacy.  3. Diabetes mellitus, insulin dependent (IDDM), controlled (HCC) Well-controlled.  Followed by Dr. Evlyn [place].  Other orders - norethindrone-ethinyl estradiol (LOESTRIN FE 1/20) 1-20 MG-MCG tablet; Take 1 tablet by mouth daily.  Samara SnideAmberly, such a pleasure seeing you today!  I will inform you of your results as soon as they are available.

## 2017-12-02 NOTE — Addendum Note (Signed)
Addended by: Berna SpareASTILLO, BLANCA A on: 12/02/2017 01:23 PM   Modules accepted: Orders

## 2017-12-05 DIAGNOSIS — E109 Type 1 diabetes mellitus without complications: Secondary | ICD-10-CM | POA: Diagnosis not present

## 2017-12-05 DIAGNOSIS — Z01419 Encounter for gynecological examination (general) (routine) without abnormal findings: Secondary | ICD-10-CM | POA: Diagnosis not present

## 2017-12-06 LAB — PAP IG W/ RFLX HPV ASCU

## 2017-12-19 DIAGNOSIS — E109 Type 1 diabetes mellitus without complications: Secondary | ICD-10-CM | POA: Diagnosis not present

## 2017-12-19 DIAGNOSIS — Z8679 Personal history of other diseases of the circulatory system: Secondary | ICD-10-CM | POA: Diagnosis not present

## 2017-12-19 DIAGNOSIS — L80 Vitiligo: Secondary | ICD-10-CM | POA: Diagnosis not present

## 2017-12-19 DIAGNOSIS — E310 Autoimmune polyglandular failure: Secondary | ICD-10-CM | POA: Diagnosis not present

## 2018-01-09 DIAGNOSIS — Z1283 Encounter for screening for malignant neoplasm of skin: Secondary | ICD-10-CM | POA: Diagnosis not present

## 2018-01-09 DIAGNOSIS — L578 Other skin changes due to chronic exposure to nonionizing radiation: Secondary | ICD-10-CM | POA: Diagnosis not present

## 2018-01-09 DIAGNOSIS — E108 Type 1 diabetes mellitus with unspecified complications: Secondary | ICD-10-CM | POA: Diagnosis not present

## 2018-03-20 DIAGNOSIS — E109 Type 1 diabetes mellitus without complications: Secondary | ICD-10-CM | POA: Diagnosis not present

## 2018-03-20 DIAGNOSIS — Z794 Long term (current) use of insulin: Secondary | ICD-10-CM | POA: Diagnosis not present

## 2018-06-20 DIAGNOSIS — E109 Type 1 diabetes mellitus without complications: Secondary | ICD-10-CM | POA: Diagnosis not present

## 2018-06-20 DIAGNOSIS — E108 Type 1 diabetes mellitus with unspecified complications: Secondary | ICD-10-CM | POA: Diagnosis not present

## 2018-06-20 DIAGNOSIS — Z794 Long term (current) use of insulin: Secondary | ICD-10-CM | POA: Diagnosis not present

## 2018-06-20 DIAGNOSIS — Z8679 Personal history of other diseases of the circulatory system: Secondary | ICD-10-CM | POA: Diagnosis not present

## 2018-06-20 DIAGNOSIS — Z1389 Encounter for screening for other disorder: Secondary | ICD-10-CM | POA: Diagnosis not present

## 2018-06-20 DIAGNOSIS — E310 Autoimmune polyglandular failure: Secondary | ICD-10-CM | POA: Diagnosis not present

## 2018-06-22 DIAGNOSIS — E109 Type 1 diabetes mellitus without complications: Secondary | ICD-10-CM | POA: Diagnosis not present

## 2018-06-30 DIAGNOSIS — L709 Acne, unspecified: Secondary | ICD-10-CM | POA: Diagnosis not present

## 2018-07-17 DIAGNOSIS — L700 Acne vulgaris: Secondary | ICD-10-CM | POA: Diagnosis not present

## 2018-12-04 ENCOUNTER — Encounter: Payer: BLUE CROSS/BLUE SHIELD | Admitting: Obstetrics & Gynecology

## 2018-12-06 ENCOUNTER — Telehealth: Payer: Self-pay | Admitting: *Deleted

## 2018-12-06 MED ORDER — NORETHIN ACE-ETH ESTRAD-FE 1-20 MG-MCG PO TABS: 1.0000 | ORAL_TABLET | Freq: Every day | ORAL | 0 refills | Status: DC

## 2018-12-06 NOTE — Telephone Encounter (Signed)
Patient has annual exam scheduled on 01/03/19, needs refill on birth control pills, Rx sent.

## 2018-12-19 ENCOUNTER — Encounter: Payer: BLUE CROSS/BLUE SHIELD | Admitting: Obstetrics & Gynecology

## 2019-01-02 MED FILL — SPIRONOLACTONE 50 MG TABS: 50 | 30 days supply | Qty: 30 | Fill #0

## 2019-01-03 ENCOUNTER — Encounter: Payer: Self-pay | Admitting: Obstetrics & Gynecology

## 2019-01-03 ENCOUNTER — Ambulatory Visit (INDEPENDENT_AMBULATORY_CARE_PROVIDER_SITE_OTHER): Payer: 59 | Admitting: Obstetrics & Gynecology

## 2019-01-03 ENCOUNTER — Other Ambulatory Visit: Payer: Self-pay

## 2019-01-03 VITALS — BP 116/78 | Ht 63.5 in | Wt 118.0 lb

## 2019-01-03 DIAGNOSIS — Z3041 Encounter for surveillance of contraceptive pills: Secondary | ICD-10-CM | POA: Diagnosis not present

## 2019-01-03 DIAGNOSIS — Z23 Encounter for immunization: Secondary | ICD-10-CM

## 2019-01-03 DIAGNOSIS — IMO0001 Reserved for inherently not codable concepts without codable children: Secondary | ICD-10-CM

## 2019-01-03 DIAGNOSIS — E119 Type 2 diabetes mellitus without complications: Secondary | ICD-10-CM | POA: Diagnosis not present

## 2019-01-03 DIAGNOSIS — Z01419 Encounter for gynecological examination (general) (routine) without abnormal findings: Secondary | ICD-10-CM

## 2019-01-03 DIAGNOSIS — Z794 Long term (current) use of insulin: Secondary | ICD-10-CM | POA: Diagnosis not present

## 2019-01-03 MED ORDER — NORETHIN ACE-ETH ESTRAD-FE 1-20 MG-MCG PO TABS: 1.0000 | ORAL_TABLET | Freq: Every day | ORAL | 4 refills | Status: DC

## 2019-01-03 NOTE — Patient Instructions (Signed)
1. Encounter for routine gynecological examination with Papanicolaou smear of cervix Normal gynecologic exam.  Pap reflex done because no transition cells were present on last year's Pap.  Breast exam normal.  Will start screening mammogram at age 38.  Health labs with Dr. Forde Dandy.  Excellent body mass index at 20.57.  Continue with fitness and healthy nutrition.  2. Encounter for surveillance of contraceptive pills Well on the generic of Loestrin FE 1/20.  No contraindication to continue on birth control pills.  Prescription sent to pharmacy.  3. Diabetes mellitus, insulin dependent (IDDM), controlled ([place]) Well controled, followed by Dr Forde Dandy.  Other orders - spironolactone (ALDACTONE) 50 MG tablet; Take 50 mg by mouth daily. - Flu Vaccine QUAD 36+ mos IM (Fluarix, Quad PF) - norethindrone-ethinyl estradiol (LOESTRIN FE 1/20) 1-20 MG-MCG tablet; Take 1 tablet by mouth daily.  Laura Browning, it was a pleasure seeing you today!  I will inform you of your results as soon as they are available.

## 2019-01-03 NOTE — Progress Notes (Signed)
Laura Browning 09/13/1980 [id number]   History:    38 y.o. 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  Family enjoys camping.  RP:  Established patient presenting for annual gyn exam   HPI: Well on the generic of Loestrin FE 1/20.  No breakthrough bleeding.  No pelvic pain.  No pain with intercourse.  Urine and bowel movements normal.  Breasts normal.  Body mass index decreased to 20.57.  Healthy nutrition/Good fitness.  IDDM well-controlled followed by Dr. Forde Dandy.  Health labs with Dr. Forde Dandy.  No family history of breast cancer or other gynecologic cancer.   Past medical history,surgical history, family history and social history were all reviewed and documented in the EPIC chart.  Gynecologic History Patient's last menstrual period was 12/27/2018. Contraception: OCP (estrogen/progesterone) Last Pap: 11/2017. Results were: Negative/HPV HR neg, satisfactory, but no TZ cells. Last mammogram: Never Bone Density: Never Colonoscopy: Never  Obstetric History OB History  Gravida Para Term Preterm AB Living  2 2       2   SAB TAB Ectopic Multiple Live Births               # Outcome Date GA Lbr Len/2nd Weight Sex Delivery Anes PTL Lv  2 Para           1 Para              ROS: A ROS was performed and pertinent positives and negatives are included in the history.  GENERAL: No fevers or chills. HEENT: No change in vision, no earache, sore throat or sinus congestion. NECK: No pain or stiffness. CARDIOVASCULAR: No chest pain or pressure. No palpitations. PULMONARY: No shortness of breath, cough or wheeze. GASTROINTESTINAL: No abdominal pain, nausea, vomiting or diarrhea, melena or bright red blood per rectum. GENITOURINARY: No urinary frequency, urgency, hesitancy or dysuria. MUSCULOSKELETAL: No joint or muscle pain, no back pain, no recent trauma. DERMATOLOGIC: No rash, no itching, no lesions. ENDOCRINE: No polyuria, polydipsia, no heat or cold intolerance. No recent  change in weight. HEMATOLOGICAL: No anemia or easy bruising or bleeding. NEUROLOGIC: No headache, seizures, numbness, tingling or weakness. PSYCHIATRIC: No depression, no loss of interest in normal activity or change in sleep pattern.     Exam:   BP 116/78   Ht 5' 3.5" (1.613 m)   Wt 118 lb (53.5 kg)   LMP 12/27/2018   BMI 20.57 kg/m   Body mass index is 20.57 kg/m.  General appearance : Well developed well nourished female. No acute distress HEENT: Eyes: no retinal hemorrhage or exudates,  Neck supple, trachea midline, no carotid bruits, no thyroidmegaly Lungs: Clear to auscultation, no rhonchi or wheezes, or rib retractions  Heart: Regular rate and rhythm, no murmurs or gallops Breast:Examined in sitting and supine position were symmetrical in appearance, no palpable masses or tenderness,  no skin retraction, no nipple inversion, no nipple discharge, no skin discoloration, no axillary or supraclavicular lymphadenopathy Abdomen: no palpable masses or tenderness, no rebound or guarding Extremities: no edema or skin discoloration or tenderness  Pelvic: Vulva: Normal             Vagina: No gross lesions or discharge  Cervix: No gross lesions or discharge.  Pap reflex done.  Uterus  AV, normal size, shape and consistency, non-tender and mobile  Adnexa  Without masses or tenderness  Anus: Normal   Assessment/Plan:  38 y.o. female for annual exam   1. Encounter for  routine gynecological examination with Papanicolaou smear of cervix Normal gynecologic exam.  Pap reflex done because no transition cells were present on last year's Pap.  Breast exam normal.  Will start screening mammogram at age 83.  Health labs with Dr. Evlyn Kanner.  Excellent body mass index at 20.57.  Continue with fitness and healthy nutrition.  2. Encounter for surveillance of contraceptive pills Well on the generic of Loestrin FE 1/20.  No contraindication to continue on birth control pills.  Prescription sent to  pharmacy.  3. Diabetes mellitus, insulin dependent (IDDM), controlled (HCC) Well controled, followed by Dr Evlyn Kanner.  Other orders - spironolactone (ALDACTONE) 50 MG tablet; Take 50 mg by mouth daily. - Flu Vaccine QUAD 36+ mos IM (Fluarix, Quad PF) - norethindrone-ethinyl estradiol (LOESTRIN FE 1/20) 1-20 MG-MCG tablet; Take 1 tablet by mouth daily.  Genia Del MD, 4:36 PM 01/03/2019

## 2019-01-04 NOTE — Addendum Note (Signed)
Addended by: Thurnell Garbe A on: 01/04/2019 08:22 AM   Modules accepted: Orders

## 2019-01-08 LAB — PAP IG W/ RFLX HPV ASCU

## 2019-01-23 MED FILL — BLISOVI FE 1/20 1-20 MG-MCG: 1-20 | 84 days supply | Qty: 84 | Fill #0

## 2019-01-23 MED FILL — CITALOPRAM HBR 10 MG TABLET: 10 | 30 days supply | Qty: 60 | Fill #0

## 2019-01-31 MED FILL — SPIRONOLACTONE 50 MG TABS: 50 | 30 days supply | Qty: 30 | Fill #1

## 2019-02-19 MED FILL — CITALOPRAM HBR 10 MG TABLET: 10 | 30 days supply | Qty: 60 | Fill #1

## 2019-02-26 MED FILL — CONTOUR NEXT STRIPS: 22 days supply | Qty: 200 | Fill #0

## 2019-02-26 MED FILL — HumaLOG 100 UNIT/ML SOLN: 100 | 18 days supply | Qty: 10 | Fill #0

## 2019-02-26 MED FILL — SPIRONOLACTONE 50 MG TABS: 50 | 30 days supply | Qty: 30 | Fill #2

## 2019-02-28 ENCOUNTER — Encounter: Payer: Self-pay | Admitting: *Deleted

## 2019-03-02 MED FILL — DOXYCYCLINE HYC 100 MG CAPS: 100 | 10 days supply | Qty: 20 | Fill #0

## 2019-03-14 MED FILL — HAILEY FE 1/20 1-20 MG-MCG: 1-20 | 28 days supply | Qty: 28 | Fill #1

## 2019-03-26 MED FILL — CITALOPRAM HBR 10 MG TABLET: 10 | 30 days supply | Qty: 60 | Fill #0

## 2019-04-03 MED FILL — HumaLOG 100 UNIT/ML SOLN: 100 | 18 days supply | Qty: 10 | Fill #1

## 2019-04-03 MED FILL — SPIRONOLACTONE 50 MG TABS: 50 | 30 days supply | Qty: 30 | Fill #3

## 2019-04-23 MED FILL — HAILEY FE 1/20 1-20 MG-MCG: 1-20 | 28 days supply | Qty: 28 | Fill #2

## 2019-04-23 MED FILL — CITALOPRAM HBR 10 MG TABLET: 10 | 30 days supply | Qty: 60 | Fill #1

## 2019-04-24 MED FILL — DOXYCYCLINE HYC 100 MG CAPS: 100 | 10 days supply | Qty: 20 | Fill #0

## 2019-04-24 MED FILL — TRIAMCINOLONE ACETONIDE 0.1: 0.1 | 30 days supply | Qty: 454 | Fill #0

## 2019-05-09 MED FILL — SPIRONOLACTONE 50 MG TABLET: 50 | 30 days supply | Qty: 30 | Fill #4

## 2019-05-09 MED FILL — HumaLOG 100 UNIT/ML SOLN: 100 | 18 days supply | Qty: 10 | Fill #2

## 2019-05-21 MED FILL — CITALOPRAM HBR 10 MG TABLET: 10 | 30 days supply | Qty: 60 | Fill #2

## 2019-05-21 MED FILL — HAILEY FE 1/20 1-20 MG-MCG: 1-20 | 28 days supply | Qty: 28 | Fill #3

## 2019-05-29 MED FILL — FLUOCINONIDE 0.1% CREAM: 0.1 | 30 days supply | Qty: 30 | Fill #0

## 2019-05-29 MED FILL — HumaLOG 100 UNIT/ML SOLN: 100 | 20 days supply | Qty: 10 | Fill #3

## 2019-06-04 ENCOUNTER — Other Ambulatory Visit (HOSPITAL_COMMUNITY): Payer: Self-pay | Admitting: Endocrinology

## 2019-06-04 MED FILL — AMLODIPINE 2.5 MG TABLET: 2.5 | 30 days supply | Qty: 30 | Fill #0

## 2019-06-05 MED FILL — MELOXICAM 15 MG TABLET: 15 | 30 days supply | Qty: 30 | Fill #0

## 2019-06-05 MED FILL — METHOCARBAMOL 750 MG TABS: 750 | 30 days supply | Qty: 30 | Fill #0

## 2019-06-07 MED FILL — CONTOUR NEXT STRIPS: 22 days supply | Qty: 200 | Fill #1

## 2019-06-07 MED FILL — SPIRONOLACTONE 50 MG TABS: 50 | 30 days supply | Qty: 30 | Fill #5

## 2019-06-18 MED FILL — CITALOPRAM HBR 10 MG TABLET: 10 | 30 days supply | Qty: 60 | Fill #3

## 2019-06-18 MED FILL — HAILEY FE 1/20 1-20 MG-MCG: 1-20 | 28 days supply | Qty: 28 | Fill #4

## 2019-06-19 MED FILL — HumaLOG 100 UNIT/ML SOLN: 100 | 20 days supply | Qty: 10 | Fill #4

## 2019-06-28 MED FILL — AMLODIPINE 2.5 MG TABLET: 2.5 | 30 days supply | Qty: 30 | Fill #1

## 2019-07-03 MED FILL — METHOCARBAMOL 750 MG TABS: 750 | 30 days supply | Qty: 30 | Fill #0

## 2019-07-05 MED FILL — CONTOUR NEXT EZ METER: W/DEVICE | 30 days supply | Qty: 1 | Fill #0

## 2019-07-12 MED FILL — HAILEY FE 1/20 1-20 MG-MCG: 1-20 | 28 days supply | Qty: 28 | Fill #5

## 2019-07-12 MED FILL — CONTOUR NEXT STRIPS: 22 days supply | Qty: 200 | Fill #2

## 2019-07-12 MED FILL — HumaLOG 100 UNIT/ML SOLN: 100 | 20 days supply | Qty: 10 | Fill #5

## 2019-07-12 MED FILL — SPIRONOLACTONE 50 MG TABLET: 50 | 30 days supply | Qty: 30 | Fill #6

## 2019-07-13 MED FILL — CITALOPRAM HBR 10 MG TABLET: 10 | 30 days supply | Qty: 60 | Fill #4

## 2019-08-13 MED FILL — HAILEY FE 1/20 1-20 MG-MCG: 1-20 | 28 days supply | Qty: 28 | Fill #6

## 2019-08-13 MED FILL — CITALOPRAM HBR 10 MG TABLET: 10 | 30 days supply | Qty: 60 | Fill #5

## 2019-08-13 MED FILL — SPIRONOLACTONE 50 MG TABLET: 50 | 30 days supply | Qty: 30 | Fill #7

## 2019-08-16 DIAGNOSIS — M25512 Pain in left shoulder: Secondary | ICD-10-CM | POA: Diagnosis not present

## 2019-08-16 DIAGNOSIS — F418 Other specified anxiety disorders: Secondary | ICD-10-CM | POA: Diagnosis not present

## 2019-08-16 DIAGNOSIS — G5600 Carpal tunnel syndrome, unspecified upper limb: Secondary | ICD-10-CM | POA: Diagnosis not present

## 2019-08-16 DIAGNOSIS — E109 Type 1 diabetes mellitus without complications: Secondary | ICD-10-CM | POA: Diagnosis not present

## 2019-08-16 DIAGNOSIS — Z1331 Encounter for screening for depression: Secondary | ICD-10-CM | POA: Diagnosis not present

## 2019-08-16 MED FILL — METHOCARBAMOL 750 MG TABS: 750 | 30 days supply | Qty: 30 | Fill #0

## 2019-08-17 ENCOUNTER — Other Ambulatory Visit (HOSPITAL_COMMUNITY): Payer: Self-pay | Admitting: Endocrinology

## 2019-08-17 MED FILL — NovoLOG 100 UNIT/ML SOLN: 100 | 80 days supply | Qty: 40 | Fill #0

## 2019-08-29 MED FILL — CONTOUR NEXT STRIPS: 25 days supply | Qty: 200 | Fill #0

## 2019-09-07 MED FILL — CITALOPRAM HBR 10 MG TABLET: 10 | 30 days supply | Qty: 60 | Fill #6

## 2019-09-07 MED FILL — METHOCARBAMOL 750 MG TABS: 750 | 30 days supply | Qty: 30 | Fill #1

## 2019-09-07 MED FILL — busPIRone HCL 15 MG TABS: 15 | 30 days supply | Qty: 30 | Fill #0

## 2019-09-20 DIAGNOSIS — E1065 Type 1 diabetes mellitus with hyperglycemia: Secondary | ICD-10-CM | POA: Diagnosis not present

## 2019-10-01 MED FILL — BLISOVI FE 1/20 1-20 MG-MCG: 1-20 | 28 days supply | Qty: 28 | Fill #7

## 2019-10-02 ENCOUNTER — Other Ambulatory Visit (HOSPITAL_COMMUNITY): Payer: Self-pay | Admitting: Endocrinology

## 2019-10-02 MED FILL — DEXCOM G6 TRANSMITTER MISC: 90 days supply | Qty: 1 | Fill #0

## 2019-10-02 MED FILL — DEXCOM G6 RECEIVER DEVI: 90 days supply | Qty: 1 | Fill #0

## 2019-10-02 MED FILL — DEXCOM G6 SENSOR MISC: 90 days supply | Qty: 9 | Fill #0

## 2019-10-15 MED FILL — METHOCARBAMOL 750 MG TABS: 750 | 30 days supply | Qty: 30 | Fill #2

## 2019-10-15 MED FILL — DOXYCYCLINE HYC 100 MG CAPS: 100 | 10 days supply | Qty: 20 | Fill #2

## 2019-10-15 MED FILL — CITALOPRAM HBR 10 MG TABLET: 10 | 30 days supply | Qty: 60 | Fill #7

## 2019-10-15 MED FILL — SPIRONOLACTONE 50 MG TABS: 50 | 30 days supply | Qty: 30 | Fill #9

## 2019-10-22 MED FILL — BLISOVI FE 1/20 1-20 MG-MCG: 1-20 | 28 days supply | Qty: 28 | Fill #8

## 2019-11-05 ENCOUNTER — Other Ambulatory Visit (HOSPITAL_COMMUNITY): Payer: Self-pay | Admitting: Endocrinology

## 2019-11-12 MED FILL — CITALOPRAM HBR 10 MG TABLET: 10 | 90 days supply | Qty: 180 | Fill #0

## 2019-11-12 MED FILL — METHOCARBAMOL 750 MG TABS: 750 | 30 days supply | Qty: 30 | Fill #0

## 2019-11-12 MED FILL — SPIRONOLACTONE 50 MG TABLET: 50 | 90 days supply | Qty: 90 | Fill #0

## 2019-11-13 DIAGNOSIS — W2209XA Striking against other stationary object, initial encounter: Secondary | ICD-10-CM | POA: Diagnosis not present

## 2019-11-13 DIAGNOSIS — S01111A Laceration without foreign body of right eyelid and periocular area, initial encounter: Secondary | ICD-10-CM | POA: Diagnosis not present

## 2019-11-13 DIAGNOSIS — E1065 Type 1 diabetes mellitus with hyperglycemia: Secondary | ICD-10-CM | POA: Diagnosis not present

## 2019-11-14 DIAGNOSIS — S01111A Laceration without foreign body of right eyelid and periocular area, initial encounter: Secondary | ICD-10-CM | POA: Diagnosis not present

## 2019-11-14 DIAGNOSIS — E109 Type 1 diabetes mellitus without complications: Secondary | ICD-10-CM | POA: Diagnosis not present

## 2019-11-19 MED FILL — BLISOVI FE 1/20 1-20 MG-MCG: 1-20 | 28 days supply | Qty: 28 | Fill #9

## 2019-12-10 DIAGNOSIS — Z79899 Other long term (current) drug therapy: Secondary | ICD-10-CM | POA: Diagnosis not present

## 2019-12-10 DIAGNOSIS — E109 Type 1 diabetes mellitus without complications: Secondary | ICD-10-CM | POA: Diagnosis not present

## 2019-12-10 DIAGNOSIS — E310 Autoimmune polyglandular failure: Secondary | ICD-10-CM | POA: Diagnosis not present

## 2019-12-14 MED FILL — METHOCARBAMOL 750 MG TABS: 750 | 30 days supply | Qty: 30 | Fill #1

## 2019-12-18 MED FILL — BLISOVI FE 1/20 1-20 MG-MCG: 1-20 | 28 days supply | Qty: 28 | Fill #10

## 2019-12-21 MED FILL — NovoLOG 100 UNIT/ML SOLN: 100 | 20 days supply | Qty: 10 | Fill #1

## 2019-12-24 MED FILL — DEXCOM G6 TRANSMITTER MISC: 90 days supply | Qty: 1 | Fill #1

## 2019-12-24 MED FILL — DEXCOM G6 SENSOR MISC: 90 days supply | Qty: 9 | Fill #1

## 2020-01-14 MED FILL — METHOCARBAMOL 750 MG TABS: 750 | 30 days supply | Qty: 30 | Fill #2

## 2020-01-22 MED FILL — NovoLOG 100 UNIT/ML SOLN: 100 | 20 days supply | Qty: 10 | Fill #2

## 2020-01-29 ENCOUNTER — Other Ambulatory Visit (HOSPITAL_COMMUNITY): Payer: Self-pay | Admitting: Endocrinology

## 2020-01-29 MED FILL — METHOCARBAMOL 750 MG TABS: 750 | 30 days supply | Qty: 30 | Fill #0

## 2020-01-29 MED FILL — DOXYCYCLINE HYCLATE 100 MG: 100 | 10 days supply | Qty: 20 | Fill #3

## 2020-02-01 ENCOUNTER — Encounter: Payer: Self-pay | Admitting: Obstetrics & Gynecology

## 2020-02-01 ENCOUNTER — Ambulatory Visit (INDEPENDENT_AMBULATORY_CARE_PROVIDER_SITE_OTHER): Payer: No Typology Code available for payment source | Admitting: Obstetrics & Gynecology

## 2020-02-01 ENCOUNTER — Other Ambulatory Visit: Payer: Self-pay

## 2020-02-01 VITALS — BP 96/68 | Ht 63.5 in | Wt 121.6 lb

## 2020-02-01 DIAGNOSIS — Z3041 Encounter for surveillance of contraceptive pills: Secondary | ICD-10-CM | POA: Diagnosis not present

## 2020-02-01 DIAGNOSIS — Z01419 Encounter for gynecological examination (general) (routine) without abnormal findings: Secondary | ICD-10-CM

## 2020-02-01 DIAGNOSIS — N930 Postcoital and contact bleeding: Secondary | ICD-10-CM | POA: Diagnosis not present

## 2020-02-01 MED ORDER — NORETHIN-ETH ESTRAD-FE BIPHAS 1 MG-10 MCG / 10 MCG PO TABS: 1.0000 | ORAL_TABLET | Freq: Every day | ORAL | 4 refills | Status: DC

## 2020-02-01 NOTE — Progress Notes (Signed)
Laura Browning 08-23-1980 [id number]   History:    39 y.o. 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 Family enjoys camping.  ID:[id number] presenting for annual gyn exam   QQP:YPPJ on the generic of Loestrin FE 1/20. No breakthrough bleeding, but occasional postcoital bleeding. No pelvic pain.  No pain with intercourse. Urine and bowel movements normal. Breasts normal. Body mass index good at 21.2.  Healthy nutrition/Good fitness. IDDM well-controlled followed by Dr. Evlyn Kanner.Health labs with Dr. Evlyn Kanner. No family history of breast cancer or other gynecologic cancer.   Past medical history,surgical history, family history and social history were all reviewed and documented in the EPIC chart.  Gynecologic History No LMP recorded. (Menstrual status: Oral contraceptives).  Obstetric History OB History  Gravida Para Term Preterm AB Living  2 2       2   SAB TAB Ectopic Multiple Live Births               # Outcome Date GA Lbr Len/2nd Weight Sex Delivery Anes PTL Lv  2 Para           1 Para              ROS: A ROS was performed and pertinent positives and negatives are included in the history.  GENERAL: No fevers or chills. HEENT: No change in vision, no earache, sore throat or sinus congestion. NECK: No pain or stiffness. CARDIOVASCULAR: No chest pain or pressure. No palpitations. PULMONARY: No shortness of breath, cough or wheeze. GASTROINTESTINAL: No abdominal pain, nausea, vomiting or diarrhea, melena or bright red blood per rectum. GENITOURINARY: No urinary frequency, urgency, hesitancy or dysuria. MUSCULOSKELETAL: No joint or muscle pain, no back pain, no recent trauma. DERMATOLOGIC: No rash, no itching, no lesions. ENDOCRINE: No polyuria, polydipsia, no heat or cold intolerance. No recent change in weight. HEMATOLOGICAL: No anemia or easy bruising or bleeding. NEUROLOGIC: No headache, seizures, numbness, tingling or weakness. PSYCHIATRIC:  No depression, no loss of interest in normal activity or change in sleep pattern.     Exam:   BP 96/68   Ht 5' 3.5" (1.613 m)   Wt 121 lb 9.6 oz (55.2 kg)   BMI 21.20 kg/m   Body mass index is 21.2 kg/m.  General appearance : Well developed well nourished female. No acute distress HEENT: Eyes: no retinal hemorrhage or exudates,  Neck supple, trachea midline, no carotid bruits, no thyroidmegaly Lungs: Clear to auscultation, no rhonchi or wheezes, or rib retractions  Heart: Regular rate and rhythm, no murmurs or gallops Breast:Examined in sitting and supine position were symmetrical in appearance, no palpable masses or tenderness,  no skin retraction, no nipple inversion, no nipple discharge, no skin discoloration, no axillary or supraclavicular lymphadenopathy Abdomen: no palpable masses or tenderness, no rebound or guarding Extremities: no edema or skin discoloration or tenderness  Pelvic: Vulva: Normal             Vagina: No gross lesions or discharge  Cervix: No gross lesions or discharge.  Pap reflex done.  Uterus  AV, normal size, shape and consistency, non-tender and mobile  Adnexa  Without masses or tenderness  Anus: Normal   Assessment/Plan:  39 y.o. female for annual exam   1. Encounter for routine gynecological examination with Papanicolaou smear of cervix Normal gynecologic exam.  Pap reflex done.  Breast exam normal.  Will schedule for screening mammogram now at age 22.  IDDM followed by Dr. 41.  Health  labs with Dr. Evlyn Kanner.  Good body mass index at 21.2.  Continue with fitness and healthy nutrition.  2. Encounter for surveillance of contraceptive pills Well on birth control pills, but occasional postcoital bleeding.  Decision to try a lower dosage birth control pill with Lo Loestrin FE.  No contraindication.  Prescription sent to pharmacy.  3. Postcoital bleeding Normal gynecologic exam.  Changing the birth control pill dosage to Lo Loestrin Fe.  Recommend  calling back for pelvic ultrasound if continued postcoital bleeding.  Other orders - methocarbamol (ROBAXIN) 750 MG tablet; Take 750 mg by mouth 4 (four) times daily. - Norethindrone-Ethinyl Estradiol-Fe Biphas (LO LOESTRIN FE) 1 MG-10 MCG / 10 MCG tablet; Take 1 tablet by mouth daily.  Genia Del MD, 9:14 AM 02/01/2020

## 2020-02-03 ENCOUNTER — Encounter: Payer: Self-pay | Admitting: Obstetrics & Gynecology

## 2020-02-05 ENCOUNTER — Other Ambulatory Visit: Payer: Self-pay | Admitting: Obstetrics & Gynecology

## 2020-02-05 LAB — PAP IG W/ RFLX HPV ASCU

## 2020-02-05 MED FILL — CITALOPRAM HBR 10 MG TABLET: 10 | 90 days supply | Qty: 180 | Fill #1

## 2020-02-08 ENCOUNTER — Other Ambulatory Visit: Payer: Self-pay | Admitting: Obstetrics & Gynecology

## 2020-02-09 ENCOUNTER — Other Ambulatory Visit (HOSPITAL_COMMUNITY): Payer: Self-pay | Admitting: Endocrinology

## 2020-02-09 MED FILL — ALPRAZolam 0.25 MG TABS: 0.25 | 30 days supply | Qty: 60 | Fill #0

## 2020-02-13 ENCOUNTER — Telehealth: Payer: Self-pay | Admitting: *Deleted

## 2020-02-13 NOTE — Telephone Encounter (Signed)
PA done via cover my meds for lo Loestrin 1/10 mcg tablet, will wait for response.

## 2020-02-18 NOTE — Telephone Encounter (Signed)
Medication denied by insurance, patient will need to try/fail 2 generic oral contraceptives. I called patient and left detailed message on cell with this information. I asked her to call me to discuss.

## 2020-02-19 ENCOUNTER — Other Ambulatory Visit: Payer: Self-pay | Admitting: Obstetrics & Gynecology

## 2020-02-19 MED FILL — SPIRONOLACTONE 50 MG TABLET: 50 | 90 days supply | Qty: 90 | Fill #1

## 2020-02-19 NOTE — Telephone Encounter (Signed)
Dr.Lavoie patient insurance won't cover Lo Loestrin 1/10 mcg tablet. Patient said she is willing to go back on Junel Fe 1/20 mcg tablet. Okay to send Rx?

## 2020-02-20 ENCOUNTER — Other Ambulatory Visit: Payer: Self-pay | Admitting: Obstetrics & Gynecology

## 2020-02-20 MED FILL — BLISOVI FE 1/20 1-20 MG-MCG: 1-20 | 28 days supply | Qty: 28 | Fill #0

## 2020-02-20 NOTE — Telephone Encounter (Signed)
Yes agree with Junel Fe 1/20.

## 2020-02-20 NOTE — Telephone Encounter (Signed)
Rx sent 

## 2020-02-26 MED FILL — METHOCARBAMOL 750 MG TABS: 750 | 30 days supply | Qty: 30 | Fill #1

## 2020-03-13 MED FILL — BLISOVI FE 1/20 1-20 MG-MCG: 1-20 | 28 days supply | Qty: 28 | Fill #1

## 2020-03-17 ENCOUNTER — Other Ambulatory Visit (HOSPITAL_COMMUNITY): Payer: Self-pay | Admitting: Endocrinology

## 2020-03-17 MED FILL — DOXYCYCLINE HYCLATE 100 MG: 100 | 10 days supply | Qty: 20 | Fill #0

## 2020-03-24 MED FILL — HumaLOG 100 UNIT/ML SOLN: 100 | 80 days supply | Qty: 40 | Fill #0

## 2020-03-28 MED FILL — DEXCOM G6 TRANSMITTER MISC: 90 days supply | Qty: 1 | Fill #2

## 2020-03-28 MED FILL — DEXCOM G6 SENSOR MISC: 30 days supply | Qty: 3 | Fill #2

## 2020-04-07 MED FILL — BLISOVI FE 1/20 1-20 MG-MCG: 1-20 | 28 days supply | Qty: 28 | Fill #2

## 2020-04-10 MED FILL — METHOCARBAMOL 750 MG TABS: 750 | 30 days supply | Qty: 30 | Fill #2

## 2020-04-22 MED FILL — DEXCOM G6 SENSOR MISC: 30 days supply | Qty: 3 | Fill #3

## 2020-04-25 MED FILL — CITALOPRAM HBR 10 MG TABLET: 10 | 90 days supply | Qty: 180 | Fill #2

## 2020-04-30 ENCOUNTER — Other Ambulatory Visit: Payer: Self-pay | Admitting: Obstetrics & Gynecology

## 2020-04-30 DIAGNOSIS — Z1231 Encounter for screening mammogram for malignant neoplasm of breast: Secondary | ICD-10-CM

## 2020-05-05 MED FILL — BLISOVI FE 1/20 1-20 MG-MCG: 1-20 | 28 days supply | Qty: 28 | Fill #3

## 2020-05-05 MED FILL — AMLODIPINE BESYLATE 2.5 MG: 2.5 | 30 days supply | Qty: 30 | Fill #2

## 2020-05-12 MED FILL — METHOCARBAMOL 750 MG TABS: 750 | 30 days supply | Qty: 30 | Fill #3

## 2020-05-13 ENCOUNTER — Other Ambulatory Visit (HOSPITAL_COMMUNITY): Payer: Self-pay | Admitting: Endocrinology

## 2020-05-13 MED FILL — TRIAMCINOLONE 0.1% CREAM: 0.1 | 30 days supply | Qty: 454 | Fill #0

## 2020-05-20 MED FILL — DEXCOM G6 SENSOR MISC: 30 days supply | Qty: 3 | Fill #4

## 2020-05-23 MED FILL — SPIRONOLACTONE 50 MG TABLET: 50 | 90 days supply | Qty: 90 | Fill #2

## 2020-06-06 ENCOUNTER — Other Ambulatory Visit (HOSPITAL_COMMUNITY): Payer: Self-pay | Admitting: Endocrinology

## 2020-06-06 MED FILL — METHOCARBAMOL 750 MG TABS: 750 | 30 days supply | Qty: 30 | Fill #0

## 2020-06-06 MED FILL — BLISOVI FE 1/20 1-20 MG-MCG: 1-20 | 28 days supply | Qty: 28 | Fill #4

## 2020-06-10 MED FILL — HumaLOG 100 UNIT/ML SOLN: 100 | 80 days supply | Qty: 40 | Fill #1

## 2020-06-10 MED FILL — DEXCOM G6 TRANSMITTER MISC: 90 days supply | Qty: 1 | Fill #3

## 2020-06-19 ENCOUNTER — Other Ambulatory Visit: Payer: Self-pay

## 2020-06-19 ENCOUNTER — Ambulatory Visit
Admission: RE | Admit: 2020-06-19 | Discharge: 2020-06-19 | Disposition: A | Discharge status: Home / Self Care | Payer: No Typology Code available for payment source | Source: Ambulatory Visit | Attending: Obstetrics & Gynecology | Admitting: Obstetrics & Gynecology

## 2020-06-19 DIAGNOSIS — Z1231 Encounter for screening mammogram for malignant neoplasm of breast: Secondary | ICD-10-CM | POA: Diagnosis present

## 2020-06-20 MED FILL — DEXCOM G6 SENSOR MISC: 30 days supply | Qty: 3 | Fill #5

## 2020-07-04 MED FILL — METHOCARBAMOL 750 MG TABS: 750 | 30 days supply | Qty: 30 | Fill #1

## 2020-07-04 MED FILL — BLISOVI FE 1/20 1-20 MG-MCG: 1-20 | 28 days supply | Qty: 28 | Fill #5

## 2020-07-17 ENCOUNTER — Other Ambulatory Visit (HOSPITAL_COMMUNITY): Payer: Self-pay | Admitting: Endocrinology

## 2020-07-17 ENCOUNTER — Other Ambulatory Visit (HOSPITAL_COMMUNITY): Payer: Self-pay

## 2020-07-17 MED FILL — Citalopram Hydrobromide Tab 10 MG (Base Equiv): ORAL | 90 days supply | Qty: 180 | Fill #0 | Status: AC

## 2020-07-17 MED FILL — Continuous Glucose System Sensor: 30 days supply | Qty: 3 | Fill #0 | Status: AC

## 2020-07-18 ENCOUNTER — Other Ambulatory Visit (HOSPITAL_COMMUNITY): Payer: Self-pay

## 2020-07-18 MED ORDER — METHOCARBAMOL 750 MG PO TABS
ORAL_TABLET | ORAL | 3 refills | Status: DC
  Filled 2020-07-18: qty 30, 30d supply, fill #0
  Filled 2020-10-30: qty 30, 30d supply, fill #1
  Filled 2020-12-02: qty 30, 30d supply, fill #2
  Filled 2021-01-27: qty 30, 30d supply, fill #3

## 2020-07-25 ENCOUNTER — Other Ambulatory Visit (HOSPITAL_COMMUNITY): Payer: Self-pay

## 2020-07-28 ENCOUNTER — Other Ambulatory Visit (HOSPITAL_COMMUNITY): Payer: Self-pay

## 2020-07-30 ENCOUNTER — Other Ambulatory Visit (HOSPITAL_COMMUNITY): Payer: Self-pay

## 2020-07-30 MED FILL — Norethindrone Ace & Ethinyl Estradiol-FE Tab 1 MG-20 MCG: ORAL | 28 days supply | Qty: 28 | Fill #0 | Status: AC

## 2020-08-05 ENCOUNTER — Other Ambulatory Visit (HOSPITAL_COMMUNITY): Payer: Self-pay

## 2020-08-07 ENCOUNTER — Other Ambulatory Visit (HOSPITAL_COMMUNITY): Payer: Self-pay

## 2020-08-18 ENCOUNTER — Other Ambulatory Visit (HOSPITAL_COMMUNITY): Payer: Self-pay

## 2020-08-18 MED FILL — Spironolactone Tab 50 MG: ORAL | 90 days supply | Qty: 90 | Fill #0 | Status: AC

## 2020-08-18 MED FILL — Doxycycline Hyclate Cap 100 MG: ORAL | 10 days supply | Qty: 20 | Fill #0 | Status: AC

## 2020-08-19 ENCOUNTER — Other Ambulatory Visit (HOSPITAL_COMMUNITY): Payer: Self-pay

## 2020-08-19 MED FILL — Continuous Glucose System Transmitter: 90 days supply | Qty: 1 | Fill #0 | Status: CN

## 2020-08-19 MED FILL — Continuous Glucose System Sensor: 30 days supply | Qty: 3 | Fill #1 | Status: AC

## 2020-09-02 ENCOUNTER — Other Ambulatory Visit (HOSPITAL_COMMUNITY): Payer: Self-pay

## 2020-09-02 MED FILL — Norethindrone Ace & Ethinyl Estradiol-FE Tab 1 MG-20 MCG: ORAL | 84 days supply | Qty: 84 | Fill #1 | Status: AC

## 2020-09-16 ENCOUNTER — Other Ambulatory Visit (HOSPITAL_COMMUNITY): Payer: Self-pay

## 2020-09-16 MED ORDER — FREESTYLE LITE TEST VI STRP
ORAL_STRIP | 5 refills | Status: AC
  Filled 2020-09-16: qty 300, 45d supply, fill #0

## 2020-09-16 MED FILL — Continuous Glucose System Transmitter: 90 days supply | Qty: 1 | Fill #0 | Status: AC

## 2020-09-16 MED FILL — Continuous Glucose System Sensor: 30 days supply | Qty: 3 | Fill #2 | Status: AC

## 2020-09-22 ENCOUNTER — Other Ambulatory Visit: Payer: Self-pay | Admitting: Nurse Practitioner

## 2020-09-29 ENCOUNTER — Other Ambulatory Visit (HOSPITAL_COMMUNITY): Payer: Self-pay

## 2020-09-29 MED FILL — Methocarbamol Tab 750 MG: ORAL | 30 days supply | Qty: 30 | Fill #0 | Status: AC

## 2020-10-02 ENCOUNTER — Other Ambulatory Visit (HOSPITAL_COMMUNITY): Payer: Self-pay

## 2020-10-03 ENCOUNTER — Other Ambulatory Visit (HOSPITAL_COMMUNITY): Payer: Self-pay

## 2020-10-07 ENCOUNTER — Other Ambulatory Visit (HOSPITAL_COMMUNITY): Payer: Self-pay

## 2020-10-08 ENCOUNTER — Other Ambulatory Visit (HOSPITAL_COMMUNITY): Payer: Self-pay

## 2020-10-09 ENCOUNTER — Other Ambulatory Visit (HOSPITAL_COMMUNITY): Payer: Self-pay

## 2020-10-09 MED ORDER — INSULIN LISPRO 100 UNIT/ML IJ SOLN
INTRAMUSCULAR | 1 refills | Status: DC
  Filled 2020-10-09: qty 40, 80d supply, fill #0
  Filled 2021-01-13: qty 40, 80d supply, fill #1

## 2020-10-14 ENCOUNTER — Other Ambulatory Visit (HOSPITAL_COMMUNITY): Payer: Self-pay

## 2020-10-20 ENCOUNTER — Other Ambulatory Visit: Payer: Self-pay

## 2020-10-22 ENCOUNTER — Other Ambulatory Visit (HOSPITAL_COMMUNITY): Payer: Self-pay

## 2020-10-22 MED ORDER — DEXCOM G6 SENSOR MISC
6 refills | Status: DC
  Filled 2020-10-22: qty 3, 30d supply, fill #0
  Filled 2020-12-02: qty 3, 30d supply, fill #1
  Filled 2020-12-19: qty 3, 30d supply, fill #2
  Filled 2021-01-27: qty 3, 30d supply, fill #3
  Filled 2021-02-17: qty 3, 30d supply, fill #4

## 2020-10-30 ENCOUNTER — Other Ambulatory Visit (HOSPITAL_COMMUNITY): Payer: Self-pay

## 2020-10-30 MED FILL — Doxycycline Hyclate Cap 100 MG: ORAL | 10 days supply | Qty: 20 | Fill #1 | Status: AC

## 2020-10-31 ENCOUNTER — Other Ambulatory Visit (HOSPITAL_COMMUNITY): Payer: Self-pay

## 2020-11-03 ENCOUNTER — Other Ambulatory Visit (HOSPITAL_COMMUNITY): Payer: Self-pay

## 2020-11-04 ENCOUNTER — Other Ambulatory Visit (HOSPITAL_COMMUNITY): Payer: Self-pay

## 2020-11-04 MED FILL — Norethindrone Ace & Ethinyl Estradiol-FE Tab 1 MG-20 MCG: ORAL | 56 days supply | Qty: 56 | Fill #2 | Status: AC

## 2020-11-05 ENCOUNTER — Other Ambulatory Visit (HOSPITAL_COMMUNITY): Payer: Self-pay

## 2020-11-05 MED ORDER — CITALOPRAM HYDROBROMIDE 10 MG PO TABS
20.0000 mg | ORAL_TABLET | Freq: Every day | ORAL | 2 refills | Status: DC
  Filled 2020-11-05: qty 180, 90d supply, fill #0
  Filled 2021-01-27: qty 180, 90d supply, fill #1
  Filled 2021-04-30: qty 180, 90d supply, fill #2

## 2020-11-10 ENCOUNTER — Other Ambulatory Visit (HOSPITAL_COMMUNITY): Payer: Self-pay

## 2020-11-24 ENCOUNTER — Other Ambulatory Visit (HOSPITAL_COMMUNITY): Payer: Self-pay

## 2020-11-24 MED ORDER — SPIRONOLACTONE 50 MG PO TABS
ORAL_TABLET | ORAL | 3 refills | Status: DC
  Filled 2020-11-24: qty 90, 90d supply, fill #0
  Filled 2021-01-27: qty 90, 90d supply, fill #1
  Filled 2021-06-29: qty 90, 90d supply, fill #2
  Filled 2021-10-07: qty 90, 90d supply, fill #3

## 2020-12-02 ENCOUNTER — Other Ambulatory Visit (HOSPITAL_COMMUNITY): Payer: Self-pay

## 2020-12-19 ENCOUNTER — Other Ambulatory Visit (HOSPITAL_COMMUNITY): Payer: Self-pay

## 2020-12-19 MED FILL — Doxycycline Hyclate Cap 100 MG: ORAL | 10 days supply | Qty: 20 | Fill #2 | Status: AC

## 2020-12-24 ENCOUNTER — Other Ambulatory Visit (HOSPITAL_COMMUNITY): Payer: Self-pay

## 2020-12-26 ENCOUNTER — Other Ambulatory Visit (HOSPITAL_COMMUNITY): Payer: Self-pay

## 2021-01-01 ENCOUNTER — Other Ambulatory Visit (HOSPITAL_COMMUNITY): Payer: Self-pay

## 2021-01-01 MED FILL — Methocarbamol Tab 750 MG: ORAL | 30 days supply | Qty: 30 | Fill #1 | Status: AC

## 2021-01-13 ENCOUNTER — Other Ambulatory Visit: Payer: Self-pay | Admitting: Obstetrics & Gynecology

## 2021-01-14 ENCOUNTER — Other Ambulatory Visit (HOSPITAL_COMMUNITY): Payer: Self-pay

## 2021-01-14 MED ORDER — NORETHIN ACE-ETH ESTRAD-FE 1-20 MG-MCG PO TABS
1.0000 | ORAL_TABLET | Freq: Every day | ORAL | 0 refills | Status: DC
  Filled 2021-01-14: qty 28, 28d supply, fill #0

## 2021-01-14 NOTE — Telephone Encounter (Signed)
Last AEX 02/01/20. Scheduled AEX 02/10/21.

## 2021-01-27 ENCOUNTER — Other Ambulatory Visit (HOSPITAL_COMMUNITY): Payer: Self-pay

## 2021-02-04 ENCOUNTER — Other Ambulatory Visit (HOSPITAL_COMMUNITY): Payer: Self-pay

## 2021-02-05 ENCOUNTER — Other Ambulatory Visit (HOSPITAL_COMMUNITY): Payer: Self-pay

## 2021-02-17 ENCOUNTER — Other Ambulatory Visit (HOSPITAL_COMMUNITY): Payer: Self-pay

## 2021-02-20 ENCOUNTER — Other Ambulatory Visit: Payer: Self-pay

## 2021-02-20 ENCOUNTER — Encounter: Payer: Self-pay | Admitting: Obstetrics & Gynecology

## 2021-02-20 ENCOUNTER — Ambulatory Visit (INDEPENDENT_AMBULATORY_CARE_PROVIDER_SITE_OTHER): Payer: No Typology Code available for payment source | Admitting: Obstetrics & Gynecology

## 2021-02-20 ENCOUNTER — Other Ambulatory Visit (HOSPITAL_COMMUNITY): Payer: Self-pay

## 2021-02-20 VITALS — BP 110/62 | HR 84 | Resp 16 | Ht 63.75 in | Wt 128.0 lb

## 2021-02-20 DIAGNOSIS — Z3041 Encounter for surveillance of contraceptive pills: Secondary | ICD-10-CM | POA: Diagnosis not present

## 2021-02-20 DIAGNOSIS — Z01419 Encounter for gynecological examination (general) (routine) without abnormal findings: Secondary | ICD-10-CM | POA: Diagnosis not present

## 2021-02-20 MED ORDER — NORETHIN ACE-ETH ESTRAD-FE 1-20 MG-MCG PO TABS
1.0000 | ORAL_TABLET | Freq: Every day | ORAL | 4 refills | Status: DC
  Filled 2021-02-20: qty 84, 84d supply, fill #0
  Filled 2021-04-30: qty 84, 84d supply, fill #1
  Filled 2021-08-11: qty 84, 84d supply, fill #2
  Filled 2021-10-27: qty 84, 84d supply, fill #3
  Filled 2022-01-23: qty 84, 84d supply, fill #4

## 2021-02-20 NOTE — Progress Notes (Signed)
Laura Browning May 31, 1980 [id number]   History:    40 y.o. G2P2L2  Married.  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  Daughter 37 yo, son 25 yo.  Family enjoys camping.   RP:  Established patient presenting for annual gyn exam    HPI: Well on the generic of Loestrin FE 1/20.  No breakthrough bleedingz .  No pelvic pain.  No pain with intercourse.  Pap Neg 2021.  Urine and bowel movements normal.  Breasts normal. Mammo Neg 06/2020. Body mass index good at 22.14.  Healthy nutrition/Good fitness.  IDDM well-controlled followed by Dr. Evlyn Kanner.  Health labs with Dr. Evlyn Kanner.  No family history of breast cancer or other gynecologic cancer.  Past medical history,surgical history, family history and social history were all reviewed and documented in the EPIC chart.  Gynecologic History Patient's last menstrual period was 02/17/2021 (exact date).  Obstetric History OB History  Gravida Para Term Preterm AB Living  2 2       2   SAB IAB Ectopic Multiple Live Births               # Outcome Date GA Lbr Len/2nd Weight Sex Delivery Anes PTL Lv  2 Para           1 Para              ROS: A ROS was performed and pertinent positives and negatives are included in the history.  GENERAL: No fevers or chills. HEENT: No change in vision, no earache, sore throat or sinus congestion. NECK: No pain or stiffness. CARDIOVASCULAR: No chest pain or pressure. No palpitations. PULMONARY: No shortness of breath, cough or wheeze. GASTROINTESTINAL: No abdominal pain, nausea, vomiting or diarrhea, melena or bright red blood per rectum. GENITOURINARY: No urinary frequency, urgency, hesitancy or dysuria. MUSCULOSKELETAL: No joint or muscle pain, no back pain, no recent trauma. DERMATOLOGIC: No rash, no itching, no lesions. ENDOCRINE: No polyuria, polydipsia, no heat or cold intolerance. No recent change in weight. HEMATOLOGICAL: No anemia or easy bruising or bleeding. NEUROLOGIC: No headache, seizures, numbness, tingling or weakness. PSYCHIATRIC: No  depression, no loss of interest in normal activity or change in sleep pattern.     Exam:   BP 110/62   Pulse 84   Resp 16   Ht 5' 3.75" (1.619 m)   Wt 128 lb (58.1 kg)   LMP 02/17/2021 (Exact Date)   BMI 22.14 kg/m   Body mass index is 22.14 kg/m.  General appearance : Well developed well nourished female. No acute distress HEENT: Eyes: no retinal hemorrhage or exudates,  Neck supple, trachea midline, no carotid bruits, no thyroidmegaly Lungs: Clear to auscultation, no rhonchi or wheezes, or rib retractions  Heart: Regular rate and rhythm, no murmurs or gallops Breast:Examined in sitting and supine position were symmetrical in appearance, no palpable masses or tenderness,  no skin retraction, no nipple inversion, no nipple discharge, no skin discoloration, no axillary or supraclavicular lymphadenopathy Abdomen: no palpable masses or tenderness, no rebound or guarding Extremities: no edema or skin discoloration or tenderness  Pelvic: Vulva: Normal             Vagina: No gross lesions or discharge  Cervix: No gross lesions or discharge  Uterus  AV, normal size, shape and consistency, non-tender and mobile  Adnexa  Without masses or tenderness  Anus: Normal   Assessment/Plan:  40 y.o. female for annual exam   1. Encounter for routine gynecological examination with Papanicolaou smear of cervix Well  on the generic of Loestrin FE 1/20.  No breakthrough bleedingz .  No pelvic pain.  No pain with intercourse.  Pap Neg 2021. Will repeat next year. Urine and bowel movements normal.  Breasts normal. Mammo Neg 06/2020. Body mass index good at 22.14.  Healthy nutrition/Good fitness.  IDDM well-controlled followed by Dr. Evlyn Kanner.  Health labs with Dr. Evlyn Kanner.  No family history of breast cancer or other gynecologic cancer.   2. Encounter for surveillance of contraceptive pills Well on BCPs, no CI to continue.  Blisovi Fe 1/20 prescription sent to pharmacy.  Other orders - insulin lispro  (HUMALOG) 100 UNIT/ML injection; Humalog U-100 Insulin 100 unit/mL subcutaneous solution - citalopram (CELEXA) 10 MG tablet; Take 2 tablets by mouth daily. - ondansetron (ZOFRAN-ODT) 8 MG disintegrating tablet; Dissolve one tablet sublingual every 4 hours as needed for nausea - ALPRAZolam (XANAX) 0.25 MG tablet; 1 tablet - norethindrone-ethinyl estradiol-FE (BLISOVI FE 1/20) 1-20 MG-MCG tablet; TAKE 1 TABLET BY MOUTH DAILY.   Genia Del MD, 9:46 AM 02/20/2021

## 2021-02-21 ENCOUNTER — Other Ambulatory Visit (HOSPITAL_COMMUNITY): Payer: Self-pay

## 2021-02-21 MED FILL — Continuous Glucose System Transmitter: 90 days supply | Qty: 1 | Fill #1 | Status: AC

## 2021-02-23 ENCOUNTER — Other Ambulatory Visit (HOSPITAL_COMMUNITY): Payer: Self-pay

## 2021-02-24 ENCOUNTER — Other Ambulatory Visit (HOSPITAL_COMMUNITY): Payer: Self-pay

## 2021-02-24 MED ORDER — FREESTYLE LITE W/DEVICE KIT
PACK | 0 refills | Status: AC
  Filled 2021-02-24: qty 1, 30d supply, fill #0

## 2021-03-04 ENCOUNTER — Other Ambulatory Visit (HOSPITAL_COMMUNITY): Payer: Self-pay

## 2021-03-06 ENCOUNTER — Other Ambulatory Visit (HOSPITAL_COMMUNITY): Payer: Self-pay

## 2021-03-06 MED ORDER — METHOCARBAMOL 750 MG PO TABS
ORAL_TABLET | ORAL | 4 refills | Status: DC
  Filled 2021-03-06: qty 30, 30d supply, fill #0
  Filled 2021-04-02: qty 30, 30d supply, fill #1
  Filled 2021-04-30: qty 30, 30d supply, fill #2

## 2021-03-11 ENCOUNTER — Other Ambulatory Visit: Payer: Self-pay

## 2021-03-11 ENCOUNTER — Other Ambulatory Visit (HOSPITAL_COMMUNITY): Payer: Self-pay

## 2021-03-11 MED ORDER — DOXYCYCLINE HYCLATE 100 MG PO CAPS
ORAL_CAPSULE | ORAL | 3 refills | Status: DC
  Filled 2021-03-11: qty 20, 10d supply, fill #0

## 2021-03-11 MED ORDER — DOXYCYCLINE HYCLATE 100 MG PO CAPS
ORAL_CAPSULE | ORAL | 3 refills | Status: DC
  Filled 2021-10-27: qty 20, 10d supply, fill #0
  Filled 2022-03-09: qty 20, 10d supply, fill #1

## 2021-03-12 ENCOUNTER — Other Ambulatory Visit (HOSPITAL_COMMUNITY): Payer: Self-pay

## 2021-04-02 ENCOUNTER — Other Ambulatory Visit (HOSPITAL_COMMUNITY): Payer: Self-pay

## 2021-04-02 MED ORDER — CARESTART COVID-19 HOME TEST VI KIT
PACK | 0 refills | Status: DC
  Filled 2021-04-02: qty 4, 4d supply, fill #0

## 2021-04-04 ENCOUNTER — Telehealth: Payer: No Typology Code available for payment source | Admitting: Nurse Practitioner

## 2021-04-04 NOTE — Progress Notes (Signed)
Meant to schedule for daughter will reschedule

## 2021-04-16 ENCOUNTER — Other Ambulatory Visit (HOSPITAL_COMMUNITY): Payer: Self-pay

## 2021-04-17 ENCOUNTER — Other Ambulatory Visit (HOSPITAL_COMMUNITY): Payer: Self-pay

## 2021-04-27 ENCOUNTER — Other Ambulatory Visit (HOSPITAL_COMMUNITY): Payer: Self-pay

## 2021-04-27 MED ORDER — INSULIN LISPRO 100 UNIT/ML IJ SOLN
INTRAMUSCULAR | 1 refills | Status: DC
  Filled 2021-04-27: qty 40, 80d supply, fill #0
  Filled 2021-07-27: qty 40, 80d supply, fill #1
  Filled 2021-11-13: qty 40, 80d supply, fill #2

## 2021-04-30 ENCOUNTER — Other Ambulatory Visit (HOSPITAL_COMMUNITY): Payer: Self-pay

## 2021-05-01 ENCOUNTER — Encounter (HOSPITAL_COMMUNITY): Payer: Self-pay | Admitting: Orthopedic Surgery

## 2021-05-04 ENCOUNTER — Other Ambulatory Visit: Payer: Self-pay

## 2021-05-04 ENCOUNTER — Other Ambulatory Visit (HOSPITAL_COMMUNITY): Payer: Self-pay

## 2021-05-04 ENCOUNTER — Encounter (HOSPITAL_COMMUNITY): Payer: Self-pay | Admitting: Orthopedic Surgery

## 2021-05-04 MED ORDER — MELOXICAM 15 MG PO TABS
15.0000 mg | ORAL_TABLET | Freq: Every day | ORAL | 4 refills | Status: DC
  Filled 2021-05-04: qty 30, 30d supply, fill #0

## 2021-05-04 NOTE — Progress Notes (Signed)
PCP/Endocrinologist- Dr. Adrian Prince  Chest x-ray - N/A EKG - N/A Stress Test - N/A ECHO - N/A Cardiac Cath - N/A  CPAP - N/A  Checks Blood Sugar: insulin pump  ERAS Protcol - Clears until 1300  COVID TEST- N/A Ambulatory surgery  Anesthesia review: N  Patient verbally denies any shortness of breath, fever, cough and chest pain during phone call   -------------  SDW INSTRUCTIONS given:  Your procedure is scheduled on 05/05/21.  Report to [facility] Main Entrance "A" at 1330 P.M., and check in at the Admitting office.  Call this number if you have problems the morning of surgery:  [phone]   Remember:  Do not eat after midnight the night before your surgery  You may drink clear liquids until 1300 the morning of your surgery.   Clear liquids allowed are: Water, Non-Citrus Juices (without pulp), Carbonated Beverages, Clear Tea, Black Coffee Only, and Gatorade    Take these medicines the morning of surgery with A SIP OF WATER  citalopram (CELEXA)  fexofenadine (ALLEGRA) ondansetron (ZOFRAN-ODT)-if needed  As of today, STOP taking any Aspirin (unless otherwise instructed by your surgeon) Aleve, Naproxen, Ibuprofen, Motrin, Advil, Goody's, BC's, all herbal medications, fish oil, and all vitamins.                      Do not wear jewelry, make up, or nail polish            Do not wear lotions, powders, perfumes/colognes, or deodorant.            Do not shave 48 hours prior to surgery.  Men may shave face and neck.            Do not bring valuables to the hospital.            [facility] is not responsible for any belongings or valuables.  Do NOT Smoke (Tobacco/Vaping) 24 hours prior to your procedure If you use a CPAP at night, you may bring all equipment for your overnight stay.   Contacts, glasses, dentures or bridgework may not be worn into surgery.      For patients admitted to the hospital, discharge time will be determined by your treatment team.    Patients discharged the day of surgery will not be allowed to drive home, and someone needs to stay with them for 24 hours.    Special instructions:   [facility]- Preparing For Surgery  Before surgery, you can play an important role. Because skin is not sterile, your skin needs to be as free of germs as possible. You can reduce the number of germs on your skin by washing with CHG (chlorahexidine gluconate) Soap before surgery.  CHG is an antiseptic cleaner which kills germs and bonds with the skin to continue killing germs even after washing.    Oral Hygiene is also important to reduce your risk of infection.  Remember - BRUSH YOUR TEETH THE MORNING OF SURGERY WITH YOUR REGULAR TOOTHPASTE  Please do not use if you have an allergy to CHG or antibacterial soaps. If your skin becomes reddened/irritated stop using the CHG.  Do not shave (including legs and underarms) for at least 48 hours prior to first CHG shower. It is OK to shave your face.  Please follow these instructions carefully.   Shower the NIGHT BEFORE SURGERY and the MORNING OF SURGERY with DIAL Soap.   Pat yourself dry with a CLEAN TOWEL.  Wear CLEAN  PAJAMAS to bed the night before surgery  Place CLEAN SHEETS on your bed the night of your first shower and DO NOT SLEEP WITH PETS.   Day of Surgery: Please shower morning of surgery  Wear Clean/Comfortable clothing the morning of surgery Do not apply any deodorants/lotions.   Remember to brush your teeth WITH YOUR REGULAR TOOTHPASTE.   Questions were answered. Patient verbalized understanding of instructions.

## 2021-05-05 ENCOUNTER — Encounter (HOSPITAL_COMMUNITY): Payer: Self-pay | Admitting: Orthopedic Surgery

## 2021-05-05 ENCOUNTER — Ambulatory Visit (HOSPITAL_COMMUNITY): Payer: No Typology Code available for payment source | Admitting: Certified Registered Nurse Anesthetist

## 2021-05-05 ENCOUNTER — Encounter (HOSPITAL_COMMUNITY)
Admission: RE | Disposition: A | Discharge status: Home / Self Care | Payer: Self-pay | Source: Home / Self Care | Attending: Orthopedic Surgery

## 2021-05-05 ENCOUNTER — Other Ambulatory Visit: Payer: Self-pay

## 2021-05-05 ENCOUNTER — Other Ambulatory Visit (HOSPITAL_COMMUNITY): Payer: Self-pay

## 2021-05-05 ENCOUNTER — Ambulatory Visit (HOSPITAL_COMMUNITY)
Admission: RE | Admit: 2021-05-05 | Discharge: 2021-05-05 | Disposition: A | Discharge status: Home / Self Care | Payer: No Typology Code available for payment source | Attending: Orthopedic Surgery | Admitting: Orthopedic Surgery

## 2021-05-05 DIAGNOSIS — F32A Depression, unspecified: Secondary | ICD-10-CM | POA: Diagnosis not present

## 2021-05-05 DIAGNOSIS — Z794 Long term (current) use of insulin: Secondary | ICD-10-CM | POA: Diagnosis not present

## 2021-05-05 DIAGNOSIS — F419 Anxiety disorder, unspecified: Secondary | ICD-10-CM | POA: Diagnosis not present

## 2021-05-05 DIAGNOSIS — E109 Type 1 diabetes mellitus without complications: Secondary | ICD-10-CM | POA: Insufficient documentation

## 2021-05-05 DIAGNOSIS — G5621 Lesion of ulnar nerve, right upper limb: Secondary | ICD-10-CM | POA: Diagnosis not present

## 2021-05-05 DIAGNOSIS — G5601 Carpal tunnel syndrome, right upper limb: Secondary | ICD-10-CM | POA: Diagnosis present

## 2021-05-05 HISTORY — DX: Other seasonal allergic rhinitis: J30.2

## 2021-05-05 HISTORY — DX: Anxiety disorder, unspecified: F41.9

## 2021-05-05 HISTORY — DX: Depression, unspecified: F32.A

## 2021-05-05 HISTORY — PX: CARPAL TUNNEL WITH CUBITAL TUNNEL: SHX5608

## 2021-05-05 LAB — BASIC METABOLIC PANEL
Anion gap: 10 (ref 5–15)
BUN: 13 mg/dL (ref 6–20)
CO2: 23 mmol/L (ref 22–32)
Calcium: 9.3 mg/dL (ref 8.9–10.3)
Chloride: 104 mmol/L (ref 98–111)
Creatinine, Ser: 0.86 mg/dL (ref 0.44–1.00)
GFR, Estimated: >60 mL/min (ref >60)
Glucose, Bld: 66 mg/dL — ABNORMAL LOW (ref 70–99)
Potassium: 3.4 mmol/L — ABNORMAL LOW (ref 3.5–5.1)
Sodium: 137 mmol/L (ref 135–145)

## 2021-05-05 LAB — CBC
HCT: 40.1 % (ref 36.0–46.0)
Hemoglobin: 13.3 g/dL (ref 12.0–15.0)
MCH: 30.9 pg (ref 26.0–34.0)
MCHC: 33.2 g/dL (ref 30.0–36.0)
MCV: 93.3 fL (ref 80.0–100.0)
Platelets: 272 10*3/uL (ref 150–400)
RBC: 4.3 MIL/uL (ref 3.87–5.11)
RDW: 12 % (ref 11.5–15.5)
WBC: 8.4 10*3/uL (ref 4.0–10.5)
nRBC: 0 % (ref 0.0–0.2)

## 2021-05-05 LAB — GLUCOSE, CAPILLARY
Glucose-Capillary: 62 mg/dL — ABNORMAL LOW (ref 70–99)
Glucose-Capillary: 58 mg/dL — ABNORMAL LOW (ref 70–99)
Glucose-Capillary: 64 mg/dL — ABNORMAL LOW (ref 70–99)
Glucose-Capillary: 69 mg/dL — ABNORMAL LOW (ref 70–99)

## 2021-05-05 LAB — POCT PREGNANCY, URINE: Preg Test, Ur: NEGATIVE

## 2021-05-05 SURGERY — RELEASE, CARPAL TUNNEL AND CUBITAL TUNNEL
Anesthesia: General | Laterality: Right

## 2021-05-05 MED ORDER — EPHEDRINE SULFATE-NACL 50-0.9 MG/10ML-% IV SOSY
PREFILLED_SYRINGE | INTRAVENOUS | Status: DC | PRN
  Administered 2021-05-05: 5 mg via INTRAVENOUS

## 2021-05-05 MED ORDER — ONDANSETRON HCL 4 MG/2ML IJ SOLN
INTRAMUSCULAR | Status: AC
  Filled 2021-05-05: qty 2

## 2021-05-05 MED ORDER — PROPOFOL 1000 MG/100ML IV EMUL
INTRAVENOUS | Status: AC
  Filled 2021-05-05: qty 300

## 2021-05-05 MED ORDER — LACTATED RINGERS IV SOLN: INTRAVENOUS | Status: DC

## 2021-05-05 MED ORDER — DOXYCYCLINE HYCLATE 100 MG PO CAPS
ORAL_CAPSULE | ORAL | 0 refills | Status: DC
  Filled 2021-05-05: qty 14, 7d supply, fill #0

## 2021-05-05 MED ORDER — ONDANSETRON HCL 4 MG/2ML IJ SOLN
INTRAMUSCULAR | Status: DC | PRN
Start: 2021-05-05 — End: 2021-05-05
  Administered 2021-05-05: 4 mg via INTRAVENOUS

## 2021-05-05 MED ORDER — PHENYLEPHRINE 40 MCG/ML (10ML) SYRINGE FOR IV PUSH (FOR BLOOD PRESSURE SUPPORT)
PREFILLED_SYRINGE | INTRAVENOUS | Status: DC | PRN
Start: 2021-05-05 — End: 2021-05-05
  Administered 2021-05-05 (×2): 80 ug via INTRAVENOUS

## 2021-05-05 MED ORDER — MIDAZOLAM HCL 2 MG/2ML IJ SOLN
INTRAMUSCULAR | Status: AC
  Filled 2021-05-05: qty 2

## 2021-05-05 MED ORDER — LIDOCAINE 2% (20 MG/ML) 5 ML SYRINGE
INTRAMUSCULAR | Status: DC | PRN
Start: 2021-05-05 — End: 2021-05-05
  Administered 2021-05-05: 60 mg via INTRAVENOUS

## 2021-05-05 MED ORDER — BUPIVACAINE HCL (PF) 0.25 % IJ SOLN
INTRAMUSCULAR | Status: DC | PRN
  Administered 2021-05-05: 10 mL

## 2021-05-05 MED ORDER — LIDOCAINE 2% (20 MG/ML) 5 ML SYRINGE
INTRAMUSCULAR | Status: AC
  Filled 2021-05-05: qty 5

## 2021-05-05 MED ORDER — DEXAMETHASONE SODIUM PHOSPHATE 10 MG/ML IJ SOLN
INTRAMUSCULAR | Status: DC | PRN
  Administered 2021-05-05: 5 mg via INTRAVENOUS

## 2021-05-05 MED ORDER — ORAL CARE MOUTH RINSE: 15.0000 mL | Freq: Once | OROMUCOSAL | Status: AC

## 2021-05-05 MED ORDER — ONDANSETRON 8 MG PO TBDP
ORAL_TABLET | ORAL | 0 refills | Status: DC
  Filled 2021-05-05: qty 15, 5d supply, fill #0

## 2021-05-05 MED ORDER — PROPOFOL 10 MG/ML IV BOLUS
INTRAVENOUS | Status: AC
  Filled 2021-05-05: qty 20

## 2021-05-05 MED ORDER — CHLORHEXIDINE GLUCONATE 0.12 % MT SOLN
15.0000 mL | Freq: Once | OROMUCOSAL | Status: AC
  Administered 2021-05-05: 14:00:00 15 mL via OROMUCOSAL
  Filled 2021-05-05: qty 15

## 2021-05-05 MED ORDER — ACETAMINOPHEN 500 MG PO TABS
1000.0000 mg | ORAL_TABLET | Freq: Once | ORAL | Status: AC
  Administered 2021-05-05: 14:00:00 500 mg via ORAL
  Filled 2021-05-05: qty 2

## 2021-05-05 MED ORDER — 0.9 % SODIUM CHLORIDE (POUR BTL) OPTIME
TOPICAL | Status: DC | PRN
  Administered 2021-05-05: 17:00:00 1000 mL

## 2021-05-05 MED ORDER — FENTANYL CITRATE (PF) 250 MCG/5ML IJ SOLN
INTRAMUSCULAR | Status: AC
  Filled 2021-05-05: qty 5

## 2021-05-05 MED ORDER — DEXAMETHASONE SODIUM PHOSPHATE 10 MG/ML IJ SOLN
INTRAMUSCULAR | Status: AC
  Filled 2021-05-05: qty 1

## 2021-05-05 MED ORDER — VANCOMYCIN HCL IN DEXTROSE 1-5 GM/200ML-% IV SOLN
1000.0000 mg | INTRAVENOUS | Status: AC
  Administered 2021-05-05: 15:00:00 1000 mg via INTRAVENOUS
  Filled 2021-05-05: qty 200

## 2021-05-05 MED ORDER — BUPIVACAINE HCL (PF) 0.25 % IJ SOLN
INTRAMUSCULAR | Status: AC
  Filled 2021-05-05: qty 30

## 2021-05-05 MED ORDER — PROPOFOL 10 MG/ML IV BOLUS
INTRAVENOUS | Status: DC | PRN
  Administered 2021-05-05: 160 mg via INTRAVENOUS

## 2021-05-05 MED ORDER — FENTANYL CITRATE (PF) 250 MCG/5ML IJ SOLN
INTRAMUSCULAR | Status: DC | PRN
  Administered 2021-05-05: 100 ug via INTRAVENOUS

## 2021-05-05 MED ORDER — OXYCODONE HCL 5 MG PO TABS
ORAL_TABLET | ORAL | 0 refills | Status: DC
  Filled 2021-05-05: qty 42, 7d supply, fill #0

## 2021-05-05 MED ORDER — MIDAZOLAM HCL 2 MG/2ML IJ SOLN
INTRAMUSCULAR | Status: DC | PRN
Start: 2021-05-05 — End: 2021-05-05
  Administered 2021-05-05: 2 mg via INTRAVENOUS

## 2021-05-05 SURGICAL SUPPLY — 49 items
BAG COUNTER SPONGE SURGICOUNT (BAG) ×2 IMPLANT
BAG SURGICOUNT SPONGE COUNTING (BAG) ×1
BNDG ELASTIC 3X5.8 VLCR STR LF (GAUZE/BANDAGES/DRESSINGS) ×8 IMPLANT
BNDG ELASTIC 4X5.8 VLCR STR LF (GAUZE/BANDAGES/DRESSINGS) ×1 IMPLANT
BNDG GAUZE ELAST 4 BULKY (GAUZE/BANDAGES/DRESSINGS) ×3 IMPLANT
CORD BIPOLAR FORCEPS 12FT (ELECTRODE) ×3 IMPLANT
COVER SURGICAL LIGHT HANDLE (MISCELLANEOUS) ×3 IMPLANT
CUFF TOURN SGL QUICK 18X4 (TOURNIQUET CUFF) ×3 IMPLANT
CUFF TOURN SGL QUICK 24 (TOURNIQUET CUFF)
CUFF TRNQT CYL 24X4X16.5-23 (TOURNIQUET CUFF) IMPLANT
DRAPE SURG 17X23 STRL (DRAPES) ×3 IMPLANT
DRSG ADAPTIC 3X8 NADH LF (GAUZE/BANDAGES/DRESSINGS) ×2 IMPLANT
EVACUATOR 1/8 PVC DRAIN (DRAIN) IMPLANT
GAUZE SPONGE 4X4 12PLY STRL (GAUZE/BANDAGES/DRESSINGS) ×3 IMPLANT
GAUZE XEROFORM 1X8 LF (GAUZE/BANDAGES/DRESSINGS) ×1 IMPLANT
GAUZE XEROFORM 5X9 LF (GAUZE/BANDAGES/DRESSINGS) ×2 IMPLANT
GLOVE SURG ENC TEXT LTX SZ8 (GLOVE) ×3 IMPLANT
GLOVE SURG MICRO LTX SZ8 (GLOVE) ×3 IMPLANT
GOWN STRL REUS W/ TWL LRG LVL3 (GOWN DISPOSABLE) ×2 IMPLANT
GOWN STRL REUS W/ TWL XL LVL3 (GOWN DISPOSABLE) ×3 IMPLANT
GOWN STRL REUS W/TWL LRG LVL3 (GOWN DISPOSABLE) ×6
GOWN STRL REUS W/TWL XL LVL3 (GOWN DISPOSABLE) ×9
KIT BASIN OR (CUSTOM PROCEDURE TRAY) ×3 IMPLANT
KIT TURNOVER KIT B (KITS) ×3 IMPLANT
LOOP VESSEL MAXI BLUE (MISCELLANEOUS) IMPLANT
NDL HYPO 25GX1X1/2 BEV (NEEDLE) IMPLANT
NEEDLE HYPO 25GX1X1/2 BEV (NEEDLE) ×3 IMPLANT
NS IRRIG 1000ML POUR BTL (IV SOLUTION) ×3 IMPLANT
PACK ORTHO EXTREMITY (CUSTOM PROCEDURE TRAY) ×3 IMPLANT
PAD ARMBOARD 7.5X6 YLW CONV (MISCELLANEOUS) ×6 IMPLANT
PAD CAST 4YDX4 CTTN HI CHSV (CAST SUPPLIES) ×2 IMPLANT
PADDING CAST COTTON 4X4 STRL (CAST SUPPLIES) ×6
SLING ARM FOAM STRAP LRG (SOFTGOODS) ×2 IMPLANT
SOL PREP POV-IOD 4OZ 10% (MISCELLANEOUS) ×9 IMPLANT
SUCTION FRAZIER HANDLE 10FR (MISCELLANEOUS)
SUCTION TUBE FRAZIER 10FR DISP (MISCELLANEOUS) IMPLANT
SUT PROLENE 4 0 PS 2 18 (SUTURE) ×3 IMPLANT
SUT VIC AB 2-0 CT1 27 (SUTURE)
SUT VIC AB 2-0 CT1 TAPERPNT 27 (SUTURE) IMPLANT
SUT VIC AB 3-0 FS2 27 (SUTURE) IMPLANT
SYR CONTROL 10ML LL (SYRINGE) ×2 IMPLANT
SYSTEM CHEST DRAIN TLS 7FR (DRAIN) IMPLANT
TOWEL GREEN STERILE (TOWEL DISPOSABLE) ×3 IMPLANT
TOWEL GREEN STERILE FF (TOWEL DISPOSABLE) ×3 IMPLANT
TUBE CONNECTING 12'X1/4 (SUCTIONS) ×1
TUBE CONNECTING 12X1/4 (SUCTIONS) ×1 IMPLANT
TUBE EVACUATION TLS (MISCELLANEOUS) ×1 IMPLANT
UNDERPAD 30X36 HEAVY ABSORB (UNDERPADS AND DIAPERS) ×3 IMPLANT
WATER STERILE IRR 1000ML POUR (IV SOLUTION) ×3 IMPLANT

## 2021-05-05 NOTE — Progress Notes (Signed)
CBG 64- MD Rose made aware. Patient suspended insulin pump and juice administered. Will recheck in 15 mins.

## 2021-05-05 NOTE — Op Note (Signed)
Operative note May 05, 2021  Vikas Wegmann MD  Preoperative diagnosis right carpal tunnel syndrome.  Right cubital tunnel syndrome/ulnar nerve compression at the elbow.  Postop diagnosis: The same  Operative procedure #1 right carpal tunnel release #2 right ulnar nerve release at the elbow in situ/cubital tunnel release  Surgeon Dominica Severin  Tourniquet time less than 30 minutes  Complications none  Estimated blood loss minimal  Description of procedure: This patient is a 41 year old diabetic with history of chronic nerve compression.  She has failed traditional conservative measures and desires to proceed with the above-mentioned operative intervention.  The patient was seen by myself and anesthesia and taken to the operative theater where the patient underwent a smooth induction of general LMA anesthetic she was prepped and draped in usual sterile fashion.  Following this she then underwent a very careful and cautious look to the arm with elevation and tourniquet insufflation.  Body parts well-padded preoperative antibiotics in the form of vancomycin were given.  At this juncture a small incision 1 cm nature was made at the distal edge of the transverse carpal ligament.  Dissection was carried down palmar fascia incised and distal edge was released off of the ulnar ledge.  The patient tolerated this well.  Once this was complete we then very carefully make sure all distal fascial bands were released.  Identified the superficial palmar arch which looked excellent.  I then dissected in a proximal fashion very carefully protecting the carpal canal contents and release the transverse carpal ligament and the medial antebrachial fascia.  Patient tolerated this well.  I irrigated copiously secured hemostasis and closed wound with Prolene and placed 3 to 4 cc of Sensorcaine without epinephrine in the wound.  Following this a curvilinear incision about the elbow was made dissection was carried down.  I  very carefully pick up the nerve proximally.  The medial intermuscular septum was released the cubital tunnel was released and following this the area of Osborne's fascia/band was released and the 2 heads of the FCU both superficial and deep.  The nerve did not have any subluxation tendencies with range of motion and looked well.  This was a nice decompression.  We irrigated and closed wound after hemostasis was secured.  The wounds were dressed with Adaptic Xeroform and gauze followed by a soft bandage and sling.  She awoke from anesthesia nicely and was taken to the recovery room.  Discharge medicines doxycycline x5 to 7 days oxycodone and Zofran  Wound check in 1 week sutures out in 2 weeks.  They have my cellular phone number for any problems.  It was a pleasure to see them today anticipate in the care plan.  Collins Dimaria MD

## 2021-05-05 NOTE — H&P (Signed)
Laura Browning is an 41 y.o. female.   Chief Complaint: Patient presents for carpal and cubital tunnel release right upper extremity HPI: Patient presents for evaluation and treatment of the of their upper extremity predicament. The patient denies neck, back, chest or  abdominal pain. The patient notes that they have no lower extremity problems. The patients primary complaint is noted. We are planning surgical care pathway for the upper extremity.   Past Medical History:  Diagnosis Date   Anxiety    Depression    Diabetes mellitus without complication ([place])    type  1   Seasonal allergies     Past Surgical History:  Procedure Laterality Date   INGUINAL HERNIA REPAIR     WISDOM TOOTH EXTRACTION      Family History  Problem Relation Age of Onset   Other Mother        pcv (blood disorder)   Diabetes Maternal Uncle    Cancer Maternal Grandmother        melanoma    Hypertension Maternal Grandmother    Stroke Paternal Grandmother    Social History:  reports that she has never smoked. She has never used smokeless tobacco. She reports current alcohol use. She reports that she does not use drugs.  Allergies:  Allergies  Allergen Reactions   Augmentin [Amoxicillin-Pot Clavulanate] Anaphylaxis   Peanut-Containing Drug Products Anaphylaxis   Sulfa Antibiotics Anaphylaxis   Guaifenex La [Guaifenesin Er]     Heart palpitations    Medications Prior to Admission  Medication Sig Dispense Refill   ALPRAZolam (XANAX) 0.25 MG tablet Take 0.125 mg by mouth at bedtime as needed for sleep.     citalopram (CELEXA) 10 MG tablet TAKE 2 TABLETS BY MOUTH DAILY 180 tablet 2   citalopram (CELEXA) 10 MG tablet Take 20 mg by mouth daily.     COLLAGEN PO Take 1 Scoop by mouth daily.     doxycycline (VIBRAMYCIN) 100 MG capsule TAKE 1 CAPSULE BY MOUTH TWICE DAILY AS NEEDED FOR INFECTION, 20 capsule 3   fexofenadine (ALLEGRA) 180 MG tablet Take 180 mg by mouth daily.     insulin lispro (HUMALOG) 100  UNIT/ML injection USE UP TO 50 UNITS ONCE A DAY IN PUMP 60 mL 1   methocarbamol (ROBAXIN) 750 MG tablet TAKE 1 TABLET BY MOUTH ONCE AT NIGHT 30 tablet 3   methocarbamol (ROBAXIN) 750 MG tablet TAKE 1 TABLET BY MOUTH ONCE AT NIGHT 30 tablet 4   norethindrone-ethinyl estradiol-FE (BLISOVI FE 1/20) 1-20 MG-MCG tablet TAKE 1 TABLET BY MOUTH DAILY. 84 tablet 4   ondansetron (ZOFRAN-ODT) 8 MG disintegrating tablet Take 8 mg by mouth every 8 (eight) hours as needed for nausea or vomiting.     spironolactone (ALDACTONE) 50 MG tablet TAKE 1 TABLET BY MOUTH ONCE DAILY FOR ACNE 90 tablet 3   triamcinolone (KENALOG) 0.1 % APPLY TO LEGS TWICE A DAY IF NEEDED FOR ITCHING 454 g 3   Blood Glucose Monitoring Suppl (FREESTYLE LITE) w/Device KIT Use as directed to check blood glucose. 1 kit 0   Continuous Blood Gluc Sensor (DEXCOM G6 SENSOR) MISC Use as directed change every 10 days to monitor blood glucose continuously 10 each 6   COVID-19 At Home Antigen Test (CARESTART COVID-19 HOME TEST) KIT Use as directed 4 each 0   doxycycline (VIBRAMYCIN) 100 MG capsule TAKE 1 CAPSULE BY MOUTH TWICE A DAY AS NEEDED FOR INFECTION. (Patient not taking: Reported on 04/28/2021) 20 capsule 3   glucose blood (  FREESTYLE LITE) test strip Use to test sugar up to 8 times a day. 240 strip 5   meloxicam (MOBIC) 15 MG tablet Take 1 tablet (15 mg total) by mouth daily. 30 tablet 4    Results for orders placed or performed during the hospital encounter of 05/05/21 (from the past 48 hour(s))  Pregnancy, urine POC     Status: None   Collection Time: 05/05/21  2:00 PM  Result Value Ref Range   Preg Test, Ur NEGATIVE NEGATIVE    Comment:        THE SENSITIVITY OF THIS METHODOLOGY IS >24 mIU/mL   Glucose, capillary     Status: Abnormal   Collection Time: 05/05/21  2:09 PM  Result Value Ref Range   Glucose-Capillary 62 (L) 70 - 99 mg/dL    Comment: Glucose reference range applies only to samples taken after fasting for at least 8 hours.   CBC per protocol     Status: None   Collection Time: 05/05/21  2:20 PM  Result Value Ref Range   WBC 8.4 4.0 - 10.5 K/uL   RBC 4.30 3.87 - 5.11 MIL/uL   Hemoglobin 13.3 12.0 - 15.0 g/dL   HCT 40.1 36.0 - 46.0 %   MCV 93.3 80.0 - 100.0 fL   MCH 30.9 26.0 - 34.0 pg   MCHC 33.2 30.0 - 36.0 g/dL   RDW 12.0 11.5 - 15.5 %   Platelets 272 150 - 400 K/uL   nRBC 0.0 0.0 - 0.2 %    Comment: Performed at [facility], [place] [address] [place], [place] [address]  Basic metabolic panel per protocol     Status: Abnormal   Collection Time: 05/05/21  2:20 PM  Result Value Ref Range   Sodium 137 135 - 145 mmol/L   Potassium 3.4 (L) 3.5 - 5.1 mmol/L   Chloride 104 98 - 111 mmol/L   CO2 23 22 - 32 mmol/L   Glucose, Bld 66 (L) 70 - 99 mg/dL    Comment: Glucose reference range applies only to samples taken after fasting for at least 8 hours.   BUN 13 6 - 20 mg/dL   Creatinine, Ser 0.86 0.44 - 1.00 mg/dL   Calcium 9.3 8.9 - 10.3 mg/dL   GFR, Estimated >60 >60 mL/min    Comment: (NOTE) Calculated using the CKD-EPI Creatinine Equation (2021)    Anion gap 10 5 - 15    Comment: Performed at [facility] [address] [place], [place] [address]   No results found.  Review of Systems  Gastrointestinal: Negative.   Endocrine: Negative.   Genitourinary: Negative.   Musculoskeletal: Negative.    Blood pressure 137/78, pulse 67, temperature 97.7 F (36.5 C), temperature source Oral, resp. rate 18, height 5' 3"  (1.6 m), weight 56.7 kg, last menstrual period 04/14/2021, SpO2 100 %. Physical Exam patient demonstrates right carpal tunnel syndrome and right cubital tunnel syndrome at the elbow based upon objective exam including Tinel's and elbow flexion and median nerve compression test.  She desires to proceed with the above-mentioned surgical intervention.  The patient is alert and oriented in no acute distress. The patient complains of pain in the affected upper extremity.  The  patient is noted to have a normal HEENT exam. Lung fields show equal chest expansion and no shortness of breath. Abdomen exam is nontender without distention. Lower extremity examination does not show any fracture dislocation or blood clot symptoms. Pelvis is stable and the  neck and back are stable and nontender.   Assessment/Plan We are planning surgery for your upper extremity. The risk and benefits of surgery to include risk of bleeding, infection, anesthesia,  damage to normal structures and failure of the surgery to accomplish its intended goals of relieving symptoms and restoring function have been discussed in detail. With this in mind we plan to proceed. I have specifically discussed with the patient the pre-and postoperative regime and the dos and don'ts and risk and benefits in great detail. Risk and benefits of surgery also include risk of dystrophy(CRPS), chronic nerve pain, failure of the healing process to go onto completion and other inherent risks of surgery The relavent the pathophysiology of the disease/injury process, as well as the alternatives for treatment and postoperative course of action has been discussed in great detail with the patient who desires to proceed.  We will do everything in our power to help you (the patient) restore function to the upper extremity. It is a pleasure to see this patient today.   We will plan for right carpal and cubital tunnel release with repair as necessary.  Patient understands all risk and benefits and desires to proceed.  Willa Frater III, MD 05/05/2021, 4:30 PM

## 2021-05-05 NOTE — Anesthesia Preprocedure Evaluation (Addendum)
Anesthesia Evaluation    Reviewed: Allergy & Precautions, Patient's Chart, lab work & pertinent test results  History of Anesthesia Complications Negative for: history of anesthetic complications  Airway Mallampati: II  TM Distance: >3 FB Neck ROM: Full    Dental  (+) Dental Advisory Given, Teeth Intact   Pulmonary neg pulmonary ROS,    Pulmonary exam normal        Cardiovascular negative cardio ROS Normal cardiovascular exam     Neuro/Psych PSYCHIATRIC DISORDERS Anxiety Depression negative neurological ROS     GI/Hepatic negative GI ROS, Neg liver ROS,   Endo/Other  diabetes, Type 1, Insulin Dependent  Renal/GU negative Renal ROS     Musculoskeletal negative musculoskeletal ROS (+)   Abdominal   Peds  Hematology negative hematology ROS (+)   Anesthesia Other Findings   Reproductive/Obstetrics                            Anesthesia Physical Anesthesia Plan  ASA: 2  Anesthesia Plan: General   Post-op Pain Management: Tylenol PO (pre-op)   Induction: Intravenous  PONV Risk Score and Plan: 3 and Treatment may vary due to age or medical condition, Ondansetron, Midazolam and Scopolamine patch - Pre-op  Airway Management Planned: LMA  Additional Equipment: None  Intra-op Plan:   Post-operative Plan: Extubation in OR  Informed Consent: I have reviewed the patients History and Physical, chart, labs and discussed the procedure including the risks, benefits and alternatives for the proposed anesthesia with the patient or authorized representative who has indicated his/her understanding and acceptance.     Dental advisory given  Plan Discussed with: CRNA and Anesthesiologist  Anesthesia Plan Comments:        Anesthesia Quick Evaluation

## 2021-05-05 NOTE — Anesthesia Procedure Notes (Signed)
Procedure Name: LMA Insertion Date/Time: 05/05/2021 4:49 PM Performed by: Thelma Comp, CRNA Pre-anesthesia Checklist: Patient identified, Emergency Drugs available, Suction available and Patient being monitored Patient Re-evaluated:Patient Re-evaluated prior to induction Oxygen Delivery Method: Circle System Utilized Preoxygenation: Pre-oxygenation with 100% oxygen Induction Type: IV induction Ventilation: Mask ventilation without difficulty LMA: LMA inserted LMA Size: 4.0 Number of attempts: 1 Placement Confirmation: positive ETCO2 Tube secured with: Tape Dental Injury: Teeth and Oropharynx as per pre-operative assessment

## 2021-05-05 NOTE — Discharge Instructions (Signed)
Please remember to elevate move and massage her fingers.  Your fingers may feel unusual of the first few days.  Please keep your bandage clean and dry.  We encourage vitamin B6 100 mg a day to promote nerve health and vitamin C 1000 mg to promote wound healing.  If you take the pain medicine be sure to take a stool softener.  If you have problems Dr. Vanetta Shawl cell phone is [phone] for any problems  Your surgery went very well there were no unusual findings-Just regular nerve compression.  You may drive a car when you are off pain medicine and thinking clearly and able to drive one-handed

## 2021-05-05 NOTE — Anesthesia Postprocedure Evaluation (Signed)
Anesthesia Post Note  Patient: Laura Browning  Procedure(s) Performed: Right carpal tunnel release.  Right in situ ulnar nerve release. (Right)     Patient location during evaluation: PACU Anesthesia Type: General Level of consciousness: awake and alert Pain management: pain level controlled Vital Signs Assessment: post-procedure vital signs reviewed and stable Respiratory status: spontaneous breathing, nonlabored ventilation, respiratory function stable and patient connected to nasal cannula oxygen Cardiovascular status: blood pressure returned to baseline and stable Postop Assessment: no apparent nausea or vomiting Anesthetic complications: no   No notable events documented.  Last Vitals:  Vitals:   05/05/21 1821 05/05/21 1836  BP: 132/89 123/78  Pulse: 80 70  Resp: 14 16  Temp:  36.8 C  SpO2: 98% 100%    Last Pain:  Vitals:   05/05/21 1821  TempSrc:   PainSc: 0-No pain                 Breonia Kirstein DANIEL

## 2021-05-05 NOTE — Transfer of Care (Signed)
Immediate Anesthesia Transfer of Care Note  Patient: Laura Browning  Procedure(s) Performed: Right carpal tunnel release.  Right in situ ulnar nerve release. (Right)  Patient Location: PACU  Anesthesia Type:General  Level of Consciousness: awake, alert  and patient cooperative  Airway & Oxygen Therapy: Patient Spontanous Breathing  Post-op Assessment: Report given to RN and Post -op Vital signs reviewed and stable  Post vital signs: Reviewed and stable  Last Vitals:  Vitals Value Taken Time  BP 169/103 05/05/21 1751  Temp 36.6 C 05/05/21 1751  Pulse 98 05/05/21 1756  Resp 19 05/05/21 1756  SpO2 99 % 05/05/21 1756  Vitals shown include unvalidated device data.  Last Pain:  Vitals:   05/05/21 1407  TempSrc: Oral         Complications: No notable events documented.

## 2021-05-06 ENCOUNTER — Encounter (HOSPITAL_COMMUNITY): Payer: Self-pay | Admitting: Orthopedic Surgery

## 2021-05-27 ENCOUNTER — Other Ambulatory Visit (HOSPITAL_COMMUNITY): Payer: Self-pay

## 2021-05-27 MED ORDER — ALPRAZOLAM 0.25 MG PO TABS
0.2500 mg | ORAL_TABLET | Freq: Two times a day (BID) | ORAL | 3 refills | Status: AC
  Filled 2021-05-27: qty 60, 30d supply, fill #0

## 2021-06-02 ENCOUNTER — Other Ambulatory Visit (HOSPITAL_COMMUNITY): Payer: Self-pay

## 2021-06-03 ENCOUNTER — Other Ambulatory Visit (HOSPITAL_COMMUNITY): Payer: Self-pay

## 2021-06-03 MED ORDER — METHOCARBAMOL 750 MG PO TABS
ORAL_TABLET | ORAL | 3 refills | Status: DC
  Filled 2021-06-03: qty 30, 30d supply, fill #0
  Filled 2021-07-01: qty 30, 30d supply, fill #1
  Filled 2021-07-29: qty 30, 30d supply, fill #2
  Filled 2021-09-03: qty 30, 30d supply, fill #3

## 2021-06-29 ENCOUNTER — Other Ambulatory Visit (HOSPITAL_COMMUNITY): Payer: Self-pay

## 2021-07-01 ENCOUNTER — Other Ambulatory Visit (HOSPITAL_COMMUNITY): Payer: Self-pay

## 2021-07-09 ENCOUNTER — Other Ambulatory Visit: Payer: Self-pay | Admitting: Endocrinology

## 2021-07-09 DIAGNOSIS — Z1231 Encounter for screening mammogram for malignant neoplasm of breast: Secondary | ICD-10-CM

## 2021-07-15 ENCOUNTER — Other Ambulatory Visit (HOSPITAL_COMMUNITY): Payer: Self-pay

## 2021-07-15 ENCOUNTER — Telehealth: Payer: No Typology Code available for payment source | Admitting: Urgent Care

## 2021-07-15 DIAGNOSIS — J069 Acute upper respiratory infection, unspecified: Secondary | ICD-10-CM | POA: Diagnosis not present

## 2021-07-15 DIAGNOSIS — J302 Other seasonal allergic rhinitis: Secondary | ICD-10-CM

## 2021-07-15 MED ORDER — MONTELUKAST SODIUM 10 MG PO TABS
10.0000 mg | ORAL_TABLET | Freq: Every day | ORAL | 0 refills | Status: DC
  Filled 2021-07-15: qty 30, 30d supply, fill #0

## 2021-07-15 MED ORDER — LEVOCETIRIZINE DIHYDROCHLORIDE 5 MG PO TABS
5.0000 mg | ORAL_TABLET | Freq: Every evening | ORAL | 0 refills | Status: DC
Start: 2021-07-15 — End: 2022-04-15
  Filled 2021-07-15: qty 30, 30d supply, fill #0

## 2021-07-15 MED ORDER — AZELASTINE HCL 137 MCG/SPRAY NA SOLN
1.0000 | Freq: Two times a day (BID) | NASAL | 0 refills | Status: DC
Start: 2021-07-15 — End: 2022-04-15
  Filled 2021-07-15: qty 30, 25d supply, fill #0

## 2021-07-15 NOTE — Progress Notes (Signed)
?Virtual Visit Consent  ? ?Laura Browning, you are scheduled for a virtual visit with a [facility] provider today.   ?  ?Just as with appointments in the office, your consent must be obtained to participate.  Your consent will be active for this visit and any virtual visit you may have with one of our providers in the next 365 days.   ?  ?If you have a MyChart account, a copy of this consent can be sent to you electronically.  All virtual visits are billed to your insurance company just like a traditional visit in the office.   ? ?As this is a virtual visit, video technology does not allow for your provider to perform a traditional examination.  This may limit your provider's ability to fully assess your condition.  If your provider identifies any concerns that need to be evaluated in person or the need to arrange testing (such as labs, EKG, etc.), we will make arrangements to do so.   ?  ?Although advances in technology are sophisticated, we cannot ensure that it will always work on either your end or our end.  If the connection with a video visit is poor, the visit may have to be switched to a telephone visit.  With either a video or telephone visit, we are not always able to ensure that we have a secure connection.    ? ?I need to obtain your verbal consent now.   Are you willing to proceed with your visit today?  ?  ?Laura Browning has provided verbal consent on 07/15/2021 for a virtual visit (video or telephone). ?  ?Laura Nayak L Ahmaud Duthie, PA  ? ?Date: 07/15/2021 2:17 PM ? ? ?Virtual Visit via Video Note  ? ?I, Laura Browning, connected with  Laura Browning  ([id number], [birth year]) on 07/15/21 at  1:15 PM EDT by a video-enabled telemedicine application and verified that I am speaking with the correct person using two identifiers. ? ?Location: ?Patient: Virtual Visit Location Patient: Other: Car ?Provider: Virtual Visit Location Provider: Home Office ?  ?I discussed the limitations of evaluation and management by  telemedicine and the availability of in person appointments. The patient expressed understanding and agreed to proceed.   ? ?History of Present Illness: ? ?Laura Browning is a 41 y.o. who identifies as a female who was assigned female at birth, and is being seen today for concerns of bronchitis. She reports one week of cough, worse at night and in the morning which improves throughout the day. She states she had bronchitis 5 years ago. She was treated with Hydromet at that time and took a few doses of her old prescription with some relief. She is having mucous in her chest and just developed green phlegm this morning. She has been having a mild headache as well with mucous in her sinuses, but states this does not feel like a sinus infection to her. Pt endorses post nasal drainage. She has had on and off pressure in her ears. She does not smoke, has a known hx of allergies for which she takes daily allegra. Pt has no hx of asthma and denies shortness of breath, wheezing, stridor or difficulty breathing. She states her sternum feels pressured. She has tried OTC cough lozenges, tylenol and chlor-trimeton. She states her 42yo daughter was sick prior to her, but her daughter is improved and she is not. No additional concerns. ? ?HPI: HPI  ?Problems: There are no problems to display for this  patient. ?  ?Allergies:  ?Allergies  ?Allergen Reactions  ? Augmentin [Amoxicillin-Pot Clavulanate] Anaphylaxis  ? Peanut-Containing Drug Products Anaphylaxis  ? Sulfa Antibiotics Anaphylaxis  ? Guaifenex La [Guaifenesin Er]   ?  Heart palpitations  ? ?Medications:  ?Current Outpatient Medications:  ?  Azelastine HCl 137 MCG/SPRAY SOLN, Place 1-2 sprays into the nose in the morning and at bedtime., Disp: 30 mL, Rfl: 0 ?  levocetirizine (XYZAL) 5 MG tablet, Take 1 tablet (5 mg total) by mouth every evening., Disp: 30 tablet, Rfl: 0 ?  montelukast (SINGULAIR) 10 MG tablet, Take 1 tablet (10 mg total) by mouth at bedtime., Disp: 30  tablet, Rfl: 0 ?  ALPRAZolam (XANAX) 0.25 MG tablet, Take 0.125 mg by mouth at bedtime as needed for sleep., Disp: , Rfl:  ?  ALPRAZolam (XANAX) 0.25 MG tablet, TAKE 1 TABLET BY MOUTH TWO TIMES DAILY, Disp: 60 tablet, Rfl: 3 ?  Blood Glucose Monitoring Suppl (FREESTYLE LITE) w/Device KIT, Use as directed to check blood glucose., Disp: 1 kit, Rfl: 0 ?  citalopram (CELEXA) 10 MG tablet, TAKE 2 TABLETS BY MOUTH DAILY, Disp: 180 tablet, Rfl: 2 ?  Continuous Blood Gluc Sensor (DEXCOM G6 SENSOR) MISC, Use as directed change every 10 days to monitor blood glucose continuously, Disp: 10 each, Rfl: 6 ?  COVID-19 At Home Antigen Test [facility] COVID-19 HOME TEST) KIT, Use as directed, Disp: 4 each, Rfl: 0 ?  doxycycline (VIBRAMYCIN) 100 MG capsule, TAKE 1 CAPSULE BY MOUTH TWICE DAILY AS NEEDED FOR INFECTION,, Disp: 20 capsule, Rfl: 3 ?  doxycycline (VIBRAMYCIN) 100 MG capsule, Take 1 capsule by mouth twice a day for 7 days, Disp: 14 capsule, Rfl: 0 ?  glucose blood (FREESTYLE LITE) test strip, Use to test sugar up to 8 times a day., Disp: 240 strip, Rfl: 5 ?  insulin lispro (HUMALOG) 100 UNIT/ML injection, USE UP TO 50 UNITS ONCE A DAY IN PUMP, Disp: 60 mL, Rfl: 1 ?  meloxicam (MOBIC) 15 MG tablet, Take 1 tablet (15 mg total) by mouth daily., Disp: 30 tablet, Rfl: 4 ?  methocarbamol (ROBAXIN) 750 MG tablet, TAKE 1 TABLET BY MOUTH ONCE AT NIGHT, Disp: 30 tablet, Rfl: 3 ?  norethindrone-ethinyl estradiol-FE (BLISOVI FE 1/20) 1-20 MG-MCG tablet, TAKE 1 TABLET BY MOUTH DAILY., Disp: 84 tablet, Rfl: 4 ?  ondansetron (ZOFRAN-ODT) 8 MG disintegrating tablet, Take 8 mg by mouth every 8 (eight) hours as needed for nausea or vomiting., Disp: , Rfl:  ?  ondansetron (ZOFRAN-ODT) 8 MG disintegrating tablet, Dissolve 1 tablet by mouth every 8 hours as needed for nausea for 5 days., Disp: 15 tablet, Rfl: 0 ?  spironolactone (ALDACTONE) 50 MG tablet, TAKE 1 TABLET BY MOUTH ONCE DAILY FOR ACNE, Disp: 90 tablet, Rfl:  3 ? ?Observations/Objective: ?Patient is well-developed, well-nourished in no acute distress.  ?Resting comfortably in her car.  ?Head is normocephalic, atraumatic.  ?No labored breathing. No coughing heard on exam ?Speech is clear and coherent with logical content.  ?Patient is alert and oriented at baseline.  ? ? ?Assessment and Plan: ?1. Seasonal allergic rhinitis, unspecified trigger ? ?2. Viral upper respiratory tract infection with cough ? ?Discussed with pt her exam and description of her cough do not sound consistent with acute bronchitis. She has a known history of allergies and was around her daughter who had a viral URI recently. Pt cannot take mucinex as she states it makes her heart race. She has been on allegra chronically. Symptoms are most likely  consistent with viral URI with post nasal drainage. I recommended stopping allegra and switching to xyzal. Will add montelukast given her hx of allergies with the new lower respiratory tract symptoms. Trial of azelastine in addition to her Rx flonase. Pt has doxycycline at home, but takes this PRN from her PCP for skin infections. ? ?Follow Up Instructions: ?I discussed the assessment and treatment plan with the patient. The patient was provided an opportunity to ask questions and all were answered. The patient agreed with the plan and demonstrated an understanding of the instructions.  A copy of instructions were sent to the patient via MyChart unless otherwise noted below.  ? ? ? ?The patient was advised to call back or seek an in-person evaluation if the symptoms worsen or if the condition fails to improve as anticipated. ? ?Time:  ?I spent 12 minutes with the patient via telehealth technology discussing the above problems/concerns.   ? ?Jasmeen Fritsch L Shayaan Parke, PA  ? ? ? ?

## 2021-07-15 NOTE — Patient Instructions (Signed)
?Laura Browning, thank you for joining Chaney Malling, PA for today's virtual visit.  While this provider is not your primary care provider (PCP), if your PCP is located in our provider database this encounter information will be shared with them immediately following your visit. ? ?Consent: ?(Patient) Laura Browning provided verbal consent for this virtual visit at the beginning of the encounter. ? ?Current Medications: ? ?Current Outpatient Medications:  ?  Azelastine HCl 137 MCG/SPRAY SOLN, Place 1-2 sprays into the nose in the morning and at bedtime., Disp: 30 mL, Rfl: 0 ?  levocetirizine (XYZAL) 5 MG tablet, Take 1 tablet (5 mg total) by mouth every evening., Disp: 30 tablet, Rfl: 0 ?  montelukast (SINGULAIR) 10 MG tablet, Take 1 tablet (10 mg total) by mouth at bedtime., Disp: 30 tablet, Rfl: 0 ?  ALPRAZolam (XANAX) 0.25 MG tablet, Take 0.125 mg by mouth at bedtime as needed for sleep., Disp: , Rfl:  ?  ALPRAZolam (XANAX) 0.25 MG tablet, TAKE 1 TABLET BY MOUTH TWO TIMES DAILY, Disp: 60 tablet, Rfl: 3 ?  Blood Glucose Monitoring Suppl (FREESTYLE LITE) w/Device KIT, Use as directed to check blood glucose., Disp: 1 kit, Rfl: 0 ?  citalopram (CELEXA) 10 MG tablet, TAKE 2 TABLETS BY MOUTH DAILY, Disp: 180 tablet, Rfl: 2 ?  Continuous Blood Gluc Sensor (DEXCOM G6 SENSOR) MISC, Use as directed change every 10 days to monitor blood glucose continuously, Disp: 10 each, Rfl: 6 ?  COVID-19 At Home Antigen Test [facility] COVID-19 HOME TEST) KIT, Use as directed, Disp: 4 each, Rfl: 0 ?  doxycycline (VIBRAMYCIN) 100 MG capsule, TAKE 1 CAPSULE BY MOUTH TWICE DAILY AS NEEDED FOR INFECTION,, Disp: 20 capsule, Rfl: 3 ?  doxycycline (VIBRAMYCIN) 100 MG capsule, Take 1 capsule by mouth twice a day for 7 days, Disp: 14 capsule, Rfl: 0 ?  glucose blood (FREESTYLE LITE) test strip, Use to test sugar up to 8 times a day., Disp: 240 strip, Rfl: 5 ?  insulin lispro (HUMALOG) 100 UNIT/ML injection, USE UP TO 50 UNITS ONCE A DAY IN  PUMP, Disp: 60 mL, Rfl: 1 ?  meloxicam (MOBIC) 15 MG tablet, Take 1 tablet (15 mg total) by mouth daily., Disp: 30 tablet, Rfl: 4 ?  methocarbamol (ROBAXIN) 750 MG tablet, TAKE 1 TABLET BY MOUTH ONCE AT NIGHT, Disp: 30 tablet, Rfl: 3 ?  norethindrone-ethinyl estradiol-FE (BLISOVI FE 1/20) 1-20 MG-MCG tablet, TAKE 1 TABLET BY MOUTH DAILY., Disp: 84 tablet, Rfl: 4 ?  ondansetron (ZOFRAN-ODT) 8 MG disintegrating tablet, Take 8 mg by mouth every 8 (eight) hours as needed for nausea or vomiting., Disp: , Rfl:  ?  ondansetron (ZOFRAN-ODT) 8 MG disintegrating tablet, Dissolve 1 tablet by mouth every 8 hours as needed for nausea for 5 days., Disp: 15 tablet, Rfl: 0 ?  spironolactone (ALDACTONE) 50 MG tablet, TAKE 1 TABLET BY MOUTH ONCE DAILY FOR ACNE, Disp: 90 tablet, Rfl: 3  ? ?Medications ordered in this encounter:  ?Meds ordered this encounter  ?Medications  ? Azelastine HCl 137 MCG/SPRAY SOLN  ?  Sig: Place 1-2 sprays into the nose in the morning and at bedtime.  ?  Dispense:  30 mL  ?  Refill:  0  ?  Order Specific Question:   Supervising Provider  ?  Answer:   Noemi Chapel [[id number]]  ? levocetirizine (XYZAL) 5 MG tablet  ?  Sig: Take 1 tablet (5 mg total) by mouth every evening.  ?  Dispense:  30 tablet  ?  Refill:  0  ?  Order Specific Question:   Supervising Provider  ?  Answer:   Noemi Chapel [[id number]]  ? montelukast (SINGULAIR) 10 MG tablet  ?  Sig: Take 1 tablet (10 mg total) by mouth at bedtime.  ?  Dispense:  30 tablet  ?  Refill:  0  ?  Order Specific Question:   Supervising Provider  ?  Answer:   Noemi Chapel [[id number]]  ?  ? ?*If you need refills on other medications prior to your next appointment, please contact your pharmacy* ? ?Follow-Up: ?Call back or seek an in-person evaluation if the symptoms worsen or if the condition fails to improve as anticipated. ? ?Other Instructions ?Your symptoms sound most consistent to a viral URI, complicated by seasonal allergies. ?Please stop your allegra, and switch to xyzal.  This is once daily,usually taken at night. ?Please also start montelukast, this is a once daily tablet at night as well. ?You can use the Azelastine nasal spray in addition to flonase. This should help clear the sinuses. ?Humidification, saline nasal lavages, and increased water intake may also be helpful. ?Monitor for any new or worsening symptoms, including fever, shortness of breath or chest pressure which would warrant an in office visit.  ? ? ?If you have been instructed to have an in-person evaluation today at a local Urgent Care facility, please use the link below. It will take you to a list of all of our available [place] [facility] [place] [facility], including address, phone number and hours of operation. Please do not delay care.  ?[facility] ? ?If you or a family member do not have a primary care provider, use the link below to schedule a visit and establish care. When you choose a [facility] primary care physician or advanced practice provider, you gain a long-term partner in health. ?Find a Primary Care Provider ? ?Learn more about [facility]'s in-office and virtual care options: ?[facility] Now  ?

## 2021-07-27 ENCOUNTER — Other Ambulatory Visit (HOSPITAL_COMMUNITY): Payer: Self-pay

## 2021-07-28 ENCOUNTER — Other Ambulatory Visit (HOSPITAL_COMMUNITY): Payer: Self-pay

## 2021-07-28 MED ORDER — CITALOPRAM HYDROBROMIDE 10 MG PO TABS
20.0000 mg | ORAL_TABLET | Freq: Every day | ORAL | 2 refills | Status: DC
  Filled 2021-07-28: qty 180, 90d supply, fill #0
  Filled 2021-10-27: qty 180, 90d supply, fill #1
  Filled 2022-01-23: qty 180, 90d supply, fill #2

## 2021-07-29 ENCOUNTER — Other Ambulatory Visit (HOSPITAL_COMMUNITY): Payer: Self-pay

## 2021-08-11 ENCOUNTER — Other Ambulatory Visit (HOSPITAL_COMMUNITY): Payer: Self-pay

## 2021-08-13 ENCOUNTER — Ambulatory Visit
Admission: RE | Admit: 2021-08-13 | Discharge: 2021-08-13 | Disposition: A | Discharge status: Home / Self Care | Payer: No Typology Code available for payment source | Source: Ambulatory Visit | Attending: Endocrinology | Admitting: Endocrinology

## 2021-08-13 DIAGNOSIS — Z1231 Encounter for screening mammogram for malignant neoplasm of breast: Secondary | ICD-10-CM | POA: Insufficient documentation

## 2021-08-18 ENCOUNTER — Other Ambulatory Visit (HOSPITAL_COMMUNITY): Payer: Self-pay

## 2021-08-18 MED ORDER — OMNIPOD 5 DEXG7G6 INTRO GEN 5 KIT
PACK | 0 refills | Status: DC
  Filled 2021-08-18: qty 1, 30d supply, fill #0
  Filled 2021-08-19: qty 1, 10d supply, fill #0

## 2021-08-18 MED ORDER — OMNIPOD 5 DEXG7G6 PODS GEN 5 MISC
3 refills | Status: DC
  Filled 2021-08-18: qty 30, 90d supply, fill #0
  Filled 2021-08-19: qty 10, 30d supply, fill #0
  Filled 2021-09-03: qty 30, 90d supply, fill #0

## 2021-08-19 ENCOUNTER — Other Ambulatory Visit (HOSPITAL_COMMUNITY): Payer: Self-pay

## 2021-08-20 ENCOUNTER — Other Ambulatory Visit (HOSPITAL_COMMUNITY): Payer: Self-pay

## 2021-09-03 ENCOUNTER — Other Ambulatory Visit (HOSPITAL_COMMUNITY): Payer: Self-pay

## 2021-09-04 ENCOUNTER — Other Ambulatory Visit (HOSPITAL_COMMUNITY): Payer: Self-pay

## 2021-09-16 ENCOUNTER — Other Ambulatory Visit (HOSPITAL_COMMUNITY): Payer: Self-pay

## 2021-10-07 ENCOUNTER — Other Ambulatory Visit (HOSPITAL_COMMUNITY): Payer: Self-pay

## 2021-10-07 MED ORDER — METHOCARBAMOL 750 MG PO TABS
ORAL_TABLET | ORAL | 6 refills | Status: DC
  Filled 2021-10-07: qty 30, 30d supply, fill #0
  Filled 2021-11-02: qty 30, 30d supply, fill #1
  Filled 2021-12-01: qty 30, 30d supply, fill #2
  Filled 2022-01-23: qty 30, 30d supply, fill #3
  Filled 2022-03-09: qty 30, 30d supply, fill #4
  Filled 2022-04-08: qty 30, 30d supply, fill #5
  Filled 2022-05-12: qty 30, 30d supply, fill #6

## 2021-10-27 ENCOUNTER — Other Ambulatory Visit (HOSPITAL_COMMUNITY): Payer: Self-pay

## 2021-11-02 ENCOUNTER — Other Ambulatory Visit (HOSPITAL_COMMUNITY): Payer: Self-pay

## 2021-11-03 ENCOUNTER — Other Ambulatory Visit (HOSPITAL_COMMUNITY): Payer: Self-pay

## 2021-11-04 ENCOUNTER — Other Ambulatory Visit (HOSPITAL_COMMUNITY): Payer: Self-pay

## 2021-11-04 MED ORDER — ONDANSETRON 8 MG PO TBDP
ORAL_TABLET | ORAL | 1 refills | Status: DC
  Filled 2021-11-04: qty 60, 30d supply, fill #0

## 2021-11-13 ENCOUNTER — Other Ambulatory Visit (HOSPITAL_COMMUNITY): Payer: Self-pay

## 2021-12-01 ENCOUNTER — Other Ambulatory Visit (HOSPITAL_COMMUNITY): Payer: Self-pay

## 2022-01-01 ENCOUNTER — Other Ambulatory Visit (HOSPITAL_COMMUNITY): Payer: Self-pay

## 2022-01-23 ENCOUNTER — Other Ambulatory Visit (HOSPITAL_COMMUNITY): Payer: Self-pay

## 2022-01-25 ENCOUNTER — Other Ambulatory Visit (HOSPITAL_COMMUNITY): Payer: Self-pay

## 2022-01-25 MED ORDER — INSULIN LISPRO 100 UNIT/ML IJ SOLN
INTRAMUSCULAR | 2 refills | Status: DC
Start: 2022-01-24 — End: 2023-03-16
  Filled 2022-01-25: qty 40, 80d supply, fill #0
  Filled 2022-04-17 (×3): qty 40, 80d supply, fill #1
  Filled 2022-08-13: qty 40, 80d supply, fill #2
  Filled 2022-11-13: qty 40, 80d supply, fill #3

## 2022-01-27 ENCOUNTER — Other Ambulatory Visit (HOSPITAL_COMMUNITY): Payer: Self-pay

## 2022-01-27 MED ORDER — TRIAMCINOLONE ACETONIDE 0.1 % EX CREA
TOPICAL_CREAM | CUTANEOUS | 4 refills | Status: DC
  Filled 2022-01-27: qty 454, 30d supply, fill #0
  Filled 2022-06-20: qty 454, 30d supply, fill #1

## 2022-01-27 MED ORDER — CITALOPRAM HYDROBROMIDE 10 MG PO TABS
20.0000 mg | ORAL_TABLET | Freq: Every day | ORAL | 3 refills | Status: DC
Start: 2022-01-27 — End: 2023-02-10
  Filled 2022-01-27: qty 180, 90d supply, fill #0
  Filled 2022-07-30: qty 180, 90d supply, fill #1
  Filled 2022-11-13: qty 180, 90d supply, fill #2

## 2022-01-28 ENCOUNTER — Other Ambulatory Visit (HOSPITAL_COMMUNITY): Payer: Self-pay

## 2022-01-29 ENCOUNTER — Other Ambulatory Visit (HOSPITAL_COMMUNITY): Payer: Self-pay

## 2022-01-30 ENCOUNTER — Other Ambulatory Visit (HOSPITAL_COMMUNITY): Payer: Self-pay

## 2022-02-02 ENCOUNTER — Other Ambulatory Visit (HOSPITAL_COMMUNITY): Payer: Self-pay

## 2022-03-09 ENCOUNTER — Other Ambulatory Visit (HOSPITAL_COMMUNITY): Payer: Self-pay

## 2022-03-10 ENCOUNTER — Other Ambulatory Visit (HOSPITAL_COMMUNITY): Payer: Self-pay

## 2022-03-15 ENCOUNTER — Other Ambulatory Visit (HOSPITAL_COMMUNITY): Payer: Self-pay

## 2022-03-15 MED ORDER — DEXCOM G7 RECEIVER DEVI
3 refills | Status: DC
  Filled 2022-03-15: qty 1, 90d supply, fill #0

## 2022-03-16 ENCOUNTER — Other Ambulatory Visit (HOSPITAL_COMMUNITY): Payer: Self-pay

## 2022-03-16 MED ORDER — DEXCOM G7 SENSOR MISC
3 refills | Status: DC
  Filled 2022-03-16: qty 3, 30d supply, fill #0
  Filled 2022-05-23: qty 3, 30d supply, fill #1
  Filled 2022-06-20: qty 3, 30d supply, fill #2
  Filled 2022-07-30: qty 3, 30d supply, fill #3
  Filled 2022-08-30: qty 3, 30d supply, fill #4
  Filled 2022-09-26: qty 3, 30d supply, fill #5
  Filled 2022-10-29: qty 3, 30d supply, fill #6
  Filled 2022-11-28: qty 3, 30d supply, fill #7
  Filled 2022-12-29: qty 3, 30d supply, fill #8
  Filled 2023-01-30: qty 3, 30d supply, fill #9
  Filled 2023-03-06: qty 3, 30d supply, fill #10

## 2022-03-22 ENCOUNTER — Other Ambulatory Visit (HOSPITAL_COMMUNITY): Payer: Self-pay

## 2022-03-25 DIAGNOSIS — E1065 Type 1 diabetes mellitus with hyperglycemia: Secondary | ICD-10-CM | POA: Diagnosis not present

## 2022-04-08 ENCOUNTER — Other Ambulatory Visit: Payer: Self-pay | Admitting: Obstetrics & Gynecology

## 2022-04-08 ENCOUNTER — Other Ambulatory Visit (HOSPITAL_COMMUNITY): Payer: Self-pay

## 2022-04-09 ENCOUNTER — Other Ambulatory Visit (HOSPITAL_COMMUNITY): Payer: Self-pay

## 2022-04-09 ENCOUNTER — Other Ambulatory Visit: Payer: Self-pay

## 2022-04-09 MED ORDER — NORETHIN ACE-ETH ESTRAD-FE 1-20 MG-MCG PO TABS
1.0000 | ORAL_TABLET | Freq: Every day | ORAL | 0 refills | Status: DC
  Filled 2022-04-09: qty 84, 84d supply, fill #0

## 2022-04-09 MED ORDER — DEXCOM G6 SENSOR MISC
1 refills | Status: DC
  Filled 2022-04-09: qty 3, 30d supply, fill #0

## 2022-04-09 MED ORDER — DOXYCYCLINE HYCLATE 100 MG PO CAPS: 100.0000 mg | ORAL_CAPSULE | Freq: Two times a day (BID) | ORAL | 3 refills | Status: DC | PRN

## 2022-04-09 MED ORDER — DOXYCYCLINE HYCLATE 100 MG PO TABS
100.0000 mg | ORAL_TABLET | Freq: Two times a day (BID) | ORAL | 3 refills | Status: DC
  Filled 2022-04-09: qty 20, 10d supply, fill #0
  Filled 2022-08-13: qty 20, 10d supply, fill #1
  Filled 2022-12-29: qty 20, 10d supply, fill #2
  Filled 2023-02-09: qty 20, 10d supply, fill #3

## 2022-04-09 NOTE — Telephone Encounter (Signed)
Last annual exam 02/2021 Scheduled on 04/15/22

## 2022-04-10 ENCOUNTER — Other Ambulatory Visit (HOSPITAL_COMMUNITY): Payer: Self-pay

## 2022-04-14 ENCOUNTER — Other Ambulatory Visit (HOSPITAL_COMMUNITY): Payer: Self-pay

## 2022-04-15 ENCOUNTER — Encounter: Payer: Self-pay | Admitting: Obstetrics & Gynecology

## 2022-04-15 ENCOUNTER — Other Ambulatory Visit (HOSPITAL_COMMUNITY)
Admission: RE | Admit: 2022-04-15 | Discharge: 2022-04-15 | Disposition: A | Discharge status: Home / Self Care | Payer: BC Managed Care – PPO | Source: Ambulatory Visit | Attending: Obstetrics & Gynecology | Admitting: Obstetrics & Gynecology

## 2022-04-15 ENCOUNTER — Other Ambulatory Visit: Payer: Self-pay

## 2022-04-15 ENCOUNTER — Ambulatory Visit (INDEPENDENT_AMBULATORY_CARE_PROVIDER_SITE_OTHER): Payer: BC Managed Care – PPO | Admitting: Obstetrics & Gynecology

## 2022-04-15 ENCOUNTER — Other Ambulatory Visit (HOSPITAL_COMMUNITY): Payer: Self-pay

## 2022-04-15 VITALS — BP 104/70 | HR 74 | Resp 16 | Ht 63.75 in | Wt 130.0 lb

## 2022-04-15 DIAGNOSIS — Z3041 Encounter for surveillance of contraceptive pills: Secondary | ICD-10-CM

## 2022-04-15 DIAGNOSIS — Z01419 Encounter for gynecological examination (general) (routine) without abnormal findings: Secondary | ICD-10-CM | POA: Diagnosis not present

## 2022-04-15 DIAGNOSIS — E109 Type 1 diabetes mellitus without complications: Secondary | ICD-10-CM | POA: Diagnosis not present

## 2022-04-15 DIAGNOSIS — Z23 Encounter for immunization: Secondary | ICD-10-CM

## 2022-04-15 MED ORDER — NORETHINDRONE ACET-ETHINYL EST 1-20 MG-MCG PO TABS
1.0000 | ORAL_TABLET | Freq: Every day | ORAL | 4 refills | Status: DC
  Filled 2022-04-15: qty 84, 84d supply, fill #0
  Filled 2022-09-26: qty 84, 84d supply, fill #1
  Filled 2023-01-30: qty 84, 84d supply, fill #2

## 2022-04-15 NOTE — Progress Notes (Signed)
GUENEVERE ROORDA 05/07/1980 [id number]   History:    42 y.o.  G2P2L2  Married.  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  Daughter 30 yo, son 63 yo.    RP:  Established patient presenting for annual gyn exam    HPI: Well on the generic of Loestrin FE 1/20.  No breakthrough bleeding.  No pelvic pain.  No pain with intercourse.  Pap Neg 2021.  Pap reflex today.  Urine and bowel movements normal. Breasts normal. Mammo Neg 08/2021. Body mass index good at 22.49 .  Healthy nutrition/Good fitness.  IDDM well-controlled followed by Dr. Forde Dandy.  Health labs with Dr. Forde Dandy.  No family history of breast cancer or other gynecologic cancer. Flu vaccine given today.  Past medical history,surgical history, family history and social history were all reviewed and documented in the EPIC chart.  Gynecologic History Patient's last menstrual period was 03/23/2022 (exact date).  Obstetric History OB History  Gravida Para Term Preterm AB Living  2 2 2     2   SAB IAB Ectopic Multiple Live Births               # Outcome Date GA Lbr Len/2nd Weight Sex Delivery Anes PTL Lv  2 Term           1 Term              ROS: A ROS was performed and pertinent positives and negatives are included in the history. GENERAL: No fevers or chills. HEENT: No change in vision, no earache, sore throat or sinus congestion. NECK: No pain or stiffness. CARDIOVASCULAR: No chest pain or pressure. No palpitations. PULMONARY: No shortness of breath, cough or wheeze. GASTROINTESTINAL: No abdominal pain, nausea, vomiting or diarrhea, melena or bright red blood per rectum. GENITOURINARY: No urinary frequency, urgency, hesitancy or dysuria. MUSCULOSKELETAL: No joint or muscle pain, no back pain, no recent trauma. DERMATOLOGIC: No rash, no itching, no lesions. ENDOCRINE: No polyuria, polydipsia, no heat or cold intolerance. No recent change in weight. HEMATOLOGICAL: No anemia or easy bruising or bleeding. NEUROLOGIC: No headache, seizures, numbness,  tingling or weakness. PSYCHIATRIC: No depression, no loss of interest in normal activity or change in sleep pattern.     Exam:   BP 104/70   Pulse 74   Resp 16   Ht 5' 3.75" (1.619 m)   Wt 130 lb (59 kg)   LMP 03/23/2022 (Exact Date) Comment: ocp's  BMI 22.49 kg/m   Body mass index is 22.49 kg/m.  General appearance : Well developed well nourished female. No acute distress HEENT: Eyes: no retinal hemorrhage or exudates,  Neck supple, trachea midline, no carotid bruits, no thyroidmegaly Lungs: Clear to auscultation, no rhonchi or wheezes, or rib retractions  Heart: Regular rate and rhythm, no murmurs or gallops Breast:Examined in sitting and supine position were symmetrical in appearance, no palpable masses or tenderness,  no skin retraction, no nipple inversion, no nipple discharge, no skin discoloration, no axillary or supraclavicular lymphadenopathy Abdomen: no palpable masses or tenderness, no rebound or guarding Extremities: no edema or skin discoloration or tenderness  Pelvic: Vulva: Normal             Vagina: No gross lesions or discharge  Cervix: No gross lesions or discharge.  Pap reflex done.  Uterus  AV, normal size, shape and consistency, non-tender and mobile  Adnexa  Without masses or tenderness  Anus: Normal   Assessment/Plan:  42 y.o. female for annual exam   1. Encounter for routine  gynecological examination with Papanicolaou smear of cervix Well on the generic of Loestrin FE 1/20.  No breakthrough bleeding.  No pelvic pain.  No pain with intercourse.  Pap Neg 2021.  Pap reflex today.  Urine and bowel movements normal. Breasts normal. Mammo Neg 08/2021. Body mass index good at 22.49 .  Healthy nutrition/Good fitness.  IDDM well-controlled followed by Dr. Forde Dandy.  Health labs with Dr. Forde Dandy.  No family history of breast cancer or other gynecologic cancer. Flu vaccine given today. - Cytology - PAP( [place])  2. Encounter for surveillance of contraceptive  pills Well on the generic of Loestrin FE 1/20.  No breakthrough bleeding.  No pelvic pain.  No pain with intercourse.  No CI to continue on the BCPs, represcribed (without Fe).  3. Need for immunization against influenza - Flu Vaccine QUAD 109mo+IM (Fluarix, Fluzone & Alfiuria Quad PF)  Other orders - fexofenadine (ALLEGRA ALLERGY) 180 MG tablet - norethindrone-ethinyl estradiol (LOESTRIN) 1-20 MG-MCG tablet; Take 1 tablet by mouth daily.   Princess Bruins MD, 8:56 AM

## 2022-04-16 LAB — CYTOLOGY - PAP: Diagnosis: NEGATIVE

## 2022-04-17 ENCOUNTER — Other Ambulatory Visit (HOSPITAL_COMMUNITY): Payer: Self-pay

## 2022-04-19 ENCOUNTER — Other Ambulatory Visit: Payer: Self-pay

## 2022-04-19 ENCOUNTER — Other Ambulatory Visit (HOSPITAL_COMMUNITY): Payer: Self-pay

## 2022-04-20 ENCOUNTER — Other Ambulatory Visit (HOSPITAL_COMMUNITY): Payer: Self-pay

## 2022-04-22 ENCOUNTER — Other Ambulatory Visit (HOSPITAL_COMMUNITY): Payer: Self-pay

## 2022-04-26 ENCOUNTER — Other Ambulatory Visit (HOSPITAL_COMMUNITY): Payer: Self-pay

## 2022-04-27 ENCOUNTER — Other Ambulatory Visit (HOSPITAL_COMMUNITY): Payer: Self-pay

## 2022-04-27 DIAGNOSIS — E109 Type 1 diabetes mellitus without complications: Secondary | ICD-10-CM | POA: Diagnosis not present

## 2022-04-27 DIAGNOSIS — E310 Autoimmune polyglandular failure: Secondary | ICD-10-CM | POA: Diagnosis not present

## 2022-04-28 ENCOUNTER — Other Ambulatory Visit (HOSPITAL_COMMUNITY): Payer: Self-pay

## 2022-04-28 ENCOUNTER — Other Ambulatory Visit: Payer: Self-pay

## 2022-04-28 MED ORDER — DEXCOM G6 SENSOR MISC
3 refills | Status: DC
  Filled 2022-04-28: qty 9, 90d supply, fill #0

## 2022-05-11 ENCOUNTER — Other Ambulatory Visit (HOSPITAL_COMMUNITY): Payer: Self-pay

## 2022-05-12 ENCOUNTER — Other Ambulatory Visit (HOSPITAL_COMMUNITY): Payer: Self-pay

## 2022-05-19 ENCOUNTER — Other Ambulatory Visit (HOSPITAL_COMMUNITY): Payer: Self-pay

## 2022-05-23 ENCOUNTER — Other Ambulatory Visit: Payer: Self-pay

## 2022-05-24 ENCOUNTER — Other Ambulatory Visit: Payer: Self-pay

## 2022-06-03 ENCOUNTER — Other Ambulatory Visit (HOSPITAL_COMMUNITY): Payer: Self-pay

## 2022-06-07 ENCOUNTER — Other Ambulatory Visit: Payer: Self-pay

## 2022-06-07 ENCOUNTER — Other Ambulatory Visit (HOSPITAL_COMMUNITY): Payer: Self-pay

## 2022-06-07 MED ORDER — SPIRONOLACTONE 50 MG PO TABS
50.0000 mg | ORAL_TABLET | Freq: Every day | ORAL | 4 refills | Status: DC
  Filled 2022-06-07: qty 90, 90d supply, fill #0
  Filled 2022-08-30: qty 90, 90d supply, fill #1
  Filled 2022-11-28: qty 90, 90d supply, fill #2
  Filled 2023-03-06: qty 90, 90d supply, fill #3

## 2022-06-07 MED ORDER — METHOCARBAMOL 750 MG PO TABS
750.0000 mg | ORAL_TABLET | Freq: Every evening | ORAL | 6 refills | Status: DC
  Filled 2022-06-07: qty 30, 30d supply, fill #0
  Filled 2022-07-15: qty 30, 30d supply, fill #1
  Filled 2022-08-13: qty 30, 30d supply, fill #2
  Filled 2022-09-17: qty 30, 30d supply, fill #3
  Filled 2022-10-23: qty 30, 30d supply, fill #4
  Filled 2022-11-28: qty 30, 30d supply, fill #5
  Filled 2022-12-29: qty 30, 30d supply, fill #6

## 2022-06-11 ENCOUNTER — Other Ambulatory Visit (HOSPITAL_COMMUNITY): Payer: Self-pay

## 2022-06-18 ENCOUNTER — Other Ambulatory Visit (HOSPITAL_COMMUNITY): Payer: Self-pay

## 2022-06-21 ENCOUNTER — Other Ambulatory Visit (HOSPITAL_COMMUNITY): Payer: Self-pay

## 2022-06-21 ENCOUNTER — Other Ambulatory Visit: Payer: Self-pay

## 2022-06-22 ENCOUNTER — Other Ambulatory Visit: Payer: Self-pay

## 2022-07-16 ENCOUNTER — Other Ambulatory Visit: Payer: Self-pay

## 2022-07-30 ENCOUNTER — Other Ambulatory Visit: Payer: Self-pay

## 2022-07-30 ENCOUNTER — Other Ambulatory Visit (HOSPITAL_COMMUNITY): Payer: Self-pay

## 2022-08-13 ENCOUNTER — Other Ambulatory Visit (HOSPITAL_COMMUNITY): Payer: Self-pay

## 2022-08-13 ENCOUNTER — Other Ambulatory Visit: Payer: Self-pay

## 2022-08-13 ENCOUNTER — Other Ambulatory Visit (HOSPITAL_BASED_OUTPATIENT_CLINIC_OR_DEPARTMENT_OTHER): Payer: Self-pay

## 2022-08-30 ENCOUNTER — Other Ambulatory Visit: Payer: Self-pay

## 2022-08-31 ENCOUNTER — Other Ambulatory Visit (HOSPITAL_COMMUNITY): Payer: Self-pay

## 2022-09-03 DIAGNOSIS — L80 Vitiligo: Secondary | ICD-10-CM | POA: Diagnosis not present

## 2022-09-03 DIAGNOSIS — F418 Other specified anxiety disorders: Secondary | ICD-10-CM | POA: Diagnosis not present

## 2022-09-03 DIAGNOSIS — E109 Type 1 diabetes mellitus without complications: Secondary | ICD-10-CM | POA: Diagnosis not present

## 2022-09-03 DIAGNOSIS — E310 Autoimmune polyglandular failure: Secondary | ICD-10-CM | POA: Diagnosis not present

## 2022-09-17 ENCOUNTER — Other Ambulatory Visit: Payer: Self-pay

## 2022-09-24 ENCOUNTER — Other Ambulatory Visit (HOSPITAL_COMMUNITY): Payer: Self-pay

## 2022-09-27 ENCOUNTER — Other Ambulatory Visit: Payer: Self-pay

## 2022-10-08 ENCOUNTER — Other Ambulatory Visit: Payer: Self-pay | Admitting: Endocrinology

## 2022-10-08 DIAGNOSIS — Z1231 Encounter for screening mammogram for malignant neoplasm of breast: Secondary | ICD-10-CM

## 2022-10-23 ENCOUNTER — Other Ambulatory Visit (HOSPITAL_COMMUNITY): Payer: Self-pay

## 2022-10-27 ENCOUNTER — Ambulatory Visit
Admission: RE | Admit: 2022-10-27 | Discharge: 2022-10-27 | Disposition: A | Discharge status: Home / Self Care | Payer: BC Managed Care – PPO | Source: Ambulatory Visit | Attending: Endocrinology | Admitting: Endocrinology

## 2022-10-27 DIAGNOSIS — Z1231 Encounter for screening mammogram for malignant neoplasm of breast: Secondary | ICD-10-CM | POA: Diagnosis not present

## 2022-10-29 ENCOUNTER — Other Ambulatory Visit: Payer: Self-pay

## 2022-10-29 DIAGNOSIS — E1065 Type 1 diabetes mellitus with hyperglycemia: Secondary | ICD-10-CM | POA: Diagnosis not present

## 2022-11-13 ENCOUNTER — Other Ambulatory Visit (HOSPITAL_COMMUNITY): Payer: Self-pay

## 2022-11-15 ENCOUNTER — Other Ambulatory Visit: Payer: Self-pay

## 2022-11-16 ENCOUNTER — Other Ambulatory Visit: Payer: Self-pay

## 2022-11-29 ENCOUNTER — Other Ambulatory Visit: Payer: Self-pay

## 2022-12-29 ENCOUNTER — Other Ambulatory Visit: Payer: Self-pay

## 2023-01-05 DIAGNOSIS — E109 Type 1 diabetes mellitus without complications: Secondary | ICD-10-CM | POA: Diagnosis not present

## 2023-01-05 DIAGNOSIS — Z23 Encounter for immunization: Secondary | ICD-10-CM | POA: Diagnosis not present

## 2023-01-30 ENCOUNTER — Other Ambulatory Visit (HOSPITAL_COMMUNITY): Payer: Self-pay

## 2023-01-31 ENCOUNTER — Other Ambulatory Visit (HOSPITAL_COMMUNITY): Payer: Self-pay

## 2023-01-31 ENCOUNTER — Other Ambulatory Visit: Payer: Self-pay

## 2023-01-31 MED ORDER — METHOCARBAMOL 750 MG PO TABS
750.0000 mg | ORAL_TABLET | Freq: Every evening | ORAL | 12 refills | Status: DC
  Filled 2023-01-31: qty 30, 30d supply, fill #0
  Filled 2023-02-25: qty 30, 30d supply, fill #1
  Filled 2023-03-31: qty 30, 30d supply, fill #2
  Filled 2023-04-29: qty 30, 30d supply, fill #3
  Filled 2023-05-25: qty 30, 30d supply, fill #4
  Filled 2023-06-18: qty 30, 30d supply, fill #5
  Filled 2023-07-25: qty 30, 30d supply, fill #6
  Filled 2023-08-23: qty 30, 30d supply, fill #7
  Filled 2023-09-20: qty 30, 30d supply, fill #8
  Filled 2023-10-19: qty 30, 30d supply, fill #9
  Filled 2023-11-19: qty 30, 30d supply, fill #10
  Filled 2023-12-19: qty 30, 30d supply, fill #11
  Filled 2024-01-16: qty 30, 30d supply, fill #12

## 2023-02-01 ENCOUNTER — Other Ambulatory Visit: Payer: Self-pay

## 2023-02-09 ENCOUNTER — Other Ambulatory Visit (HOSPITAL_COMMUNITY): Payer: Self-pay

## 2023-02-10 ENCOUNTER — Other Ambulatory Visit (HOSPITAL_COMMUNITY): Payer: Self-pay

## 2023-02-10 ENCOUNTER — Encounter: Payer: Self-pay | Admitting: Pharmacist

## 2023-02-10 ENCOUNTER — Other Ambulatory Visit: Payer: Self-pay

## 2023-02-10 MED ORDER — CITALOPRAM HYDROBROMIDE 10 MG PO TABS
20.0000 mg | ORAL_TABLET | Freq: Every day | ORAL | 4 refills | Status: DC
  Filled 2023-02-10: qty 180, 90d supply, fill #0
  Filled 2023-05-17: qty 180, 90d supply, fill #1
  Filled 2023-07-25 – 2023-08-08 (×2): qty 180, 90d supply, fill #2
  Filled 2023-12-01: qty 180, 90d supply, fill #3

## 2023-02-25 ENCOUNTER — Other Ambulatory Visit (HOSPITAL_COMMUNITY): Payer: Self-pay

## 2023-02-25 DIAGNOSIS — E109 Type 1 diabetes mellitus without complications: Secondary | ICD-10-CM | POA: Diagnosis not present

## 2023-03-07 ENCOUNTER — Other Ambulatory Visit: Payer: Self-pay

## 2023-03-15 ENCOUNTER — Other Ambulatory Visit (HOSPITAL_COMMUNITY): Payer: Self-pay

## 2023-03-15 MED ORDER — TRIAMCINOLONE ACETONIDE 0.1 % EX CREA
TOPICAL_CREAM | CUTANEOUS | 4 refills | Status: AC
  Filled 2023-03-15: qty 454, 30d supply, fill #0
  Filled 2023-06-04: qty 454, 30d supply, fill #1
  Filled 2023-12-13: qty 454, 30d supply, fill #2
  Filled 2024-03-13: qty 454, 30d supply, fill #3

## 2023-03-16 ENCOUNTER — Other Ambulatory Visit (HOSPITAL_COMMUNITY): Payer: Self-pay

## 2023-03-16 ENCOUNTER — Other Ambulatory Visit: Payer: Self-pay

## 2023-03-16 MED ORDER — INSULIN LISPRO 100 UNIT/ML IJ SOLN
50.0000 [IU] | Freq: Every day | INTRAMUSCULAR | 2 refills | Status: DC
  Filled 2023-03-16: qty 40, 80d supply, fill #0
  Filled 2023-05-17: qty 40, 80d supply, fill #1
  Filled 2023-09-20: qty 40, 80d supply, fill #2
  Filled 2023-12-13 – 2024-01-16 (×2): qty 40, 80d supply, fill #3

## 2023-03-17 ENCOUNTER — Other Ambulatory Visit: Payer: Self-pay

## 2023-03-31 ENCOUNTER — Other Ambulatory Visit: Payer: Self-pay

## 2023-04-20 ENCOUNTER — Other Ambulatory Visit (HOSPITAL_COMMUNITY): Payer: Self-pay

## 2023-04-20 ENCOUNTER — Other Ambulatory Visit: Payer: Self-pay

## 2023-04-20 MED ORDER — DEXCOM G7 SENSOR MISC
1.0000 | 3 refills | Status: AC
  Filled 2023-04-20: qty 3, 30d supply, fill #0
  Filled 2023-05-17 – 2023-05-25 (×2): qty 3, 30d supply, fill #1
  Filled 2023-06-18: qty 3, 30d supply, fill #2
  Filled 2023-07-25: qty 3, 30d supply, fill #3
  Filled 2023-08-23: qty 3, 30d supply, fill #4
  Filled 2023-09-20: qty 3, 30d supply, fill #5
  Filled 2023-10-19: qty 3, 30d supply, fill #6
  Filled 2023-11-19: qty 3, 30d supply, fill #7
  Filled 2023-12-19: qty 3, 30d supply, fill #8
  Filled 2024-01-16: qty 3, 30d supply, fill #9
  Filled 2024-02-15: qty 3, 30d supply, fill #10
  Filled 2024-03-13 – 2024-03-15 (×2): qty 3, 30d supply, fill #11
  Filled 2024-04-13 – 2024-04-14 (×2): qty 3, 30d supply, fill #12

## 2023-04-21 ENCOUNTER — Other Ambulatory Visit: Payer: Self-pay

## 2023-04-25 DIAGNOSIS — E109 Type 1 diabetes mellitus without complications: Secondary | ICD-10-CM | POA: Diagnosis not present

## 2023-04-29 ENCOUNTER — Other Ambulatory Visit: Payer: Self-pay

## 2023-05-06 DIAGNOSIS — E109 Type 1 diabetes mellitus without complications: Secondary | ICD-10-CM | POA: Diagnosis not present

## 2023-05-17 ENCOUNTER — Other Ambulatory Visit (HOSPITAL_COMMUNITY): Payer: Self-pay

## 2023-05-17 ENCOUNTER — Other Ambulatory Visit: Payer: Self-pay

## 2023-05-19 ENCOUNTER — Other Ambulatory Visit: Payer: Self-pay

## 2023-05-19 ENCOUNTER — Other Ambulatory Visit: Payer: Self-pay | Admitting: Obstetrics & Gynecology

## 2023-05-19 ENCOUNTER — Other Ambulatory Visit (HOSPITAL_COMMUNITY): Payer: Self-pay

## 2023-05-19 DIAGNOSIS — Z3041 Encounter for surveillance of contraceptive pills: Secondary | ICD-10-CM

## 2023-05-19 MED ORDER — NORETHINDRONE ACET-ETHINYL EST 1-20 MG-MCG PO TABS
1.0000 | ORAL_TABLET | Freq: Every day | ORAL | 0 refills | Status: DC
  Filled 2023-05-19: qty 21, 21d supply, fill #0

## 2023-05-19 NOTE — Telephone Encounter (Signed)
 Med refill request: Loestrin  1/20 Last AEX: 04/15/22 Dr. Lavoie Next AEX: none scheduled Last MMG (if hormonal med) 10/21/22 BI-RADS 1 negative Refill authorized: Last RX 04/15/22 Loestrin  1/20 #84 with 4 refills. Please approve or deny as appropriate.

## 2023-05-20 ENCOUNTER — Other Ambulatory Visit: Payer: Self-pay

## 2023-05-25 ENCOUNTER — Other Ambulatory Visit: Payer: Self-pay

## 2023-05-25 ENCOUNTER — Other Ambulatory Visit (HOSPITAL_COMMUNITY): Payer: Self-pay

## 2023-05-26 ENCOUNTER — Other Ambulatory Visit: Payer: Self-pay

## 2023-06-04 ENCOUNTER — Other Ambulatory Visit (HOSPITAL_COMMUNITY): Payer: Self-pay

## 2023-06-06 ENCOUNTER — Other Ambulatory Visit (HOSPITAL_COMMUNITY): Payer: Self-pay

## 2023-06-18 ENCOUNTER — Other Ambulatory Visit (HOSPITAL_COMMUNITY): Payer: Self-pay

## 2023-06-20 ENCOUNTER — Other Ambulatory Visit (HOSPITAL_COMMUNITY): Payer: Self-pay

## 2023-06-20 ENCOUNTER — Other Ambulatory Visit: Payer: Self-pay

## 2023-06-20 MED ORDER — SPIRONOLACTONE 50 MG PO TABS
50.0000 mg | ORAL_TABLET | Freq: Every day | ORAL | 4 refills | Status: AC
  Filled 2023-06-20: qty 90, 90d supply, fill #0
  Filled 2023-10-19: qty 90, 90d supply, fill #1
  Filled 2024-01-16: qty 90, 90d supply, fill #2
  Filled 2024-04-13: qty 90, 90d supply, fill #3

## 2023-06-22 ENCOUNTER — Other Ambulatory Visit (HOSPITAL_COMMUNITY): Payer: Self-pay

## 2023-06-22 ENCOUNTER — Other Ambulatory Visit: Payer: Self-pay | Admitting: Nurse Practitioner

## 2023-06-22 ENCOUNTER — Other Ambulatory Visit: Payer: Self-pay

## 2023-06-22 DIAGNOSIS — Z3041 Encounter for surveillance of contraceptive pills: Secondary | ICD-10-CM

## 2023-06-22 MED ORDER — DOXYCYCLINE HYCLATE 100 MG PO TABS
100.0000 mg | ORAL_TABLET | Freq: Two times a day (BID) | ORAL | 3 refills | Status: AC
  Filled 2023-06-22: qty 20, 10d supply, fill #0
  Filled 2024-02-23: qty 20, 10d supply, fill #1
  Filled 2024-04-13: qty 20, 10d supply, fill #2

## 2023-06-22 NOTE — Telephone Encounter (Signed)
 Med refill request: Loestrin 1/20 #21 Last AEX: 04/15/22 Dr. Seymour Bars Next AEX: none scheduled Last MMG (if hormonal med) 10/27/22 BI-RADS 1 negative Last filled 05/20/23 with note to please schedule appointment (TW) Refill denied.  Needs appointment.  Sent to provider for review.

## 2023-06-28 ENCOUNTER — Other Ambulatory Visit: Payer: Self-pay

## 2023-06-28 ENCOUNTER — Encounter: Payer: Self-pay | Admitting: Pharmacist

## 2023-06-28 ENCOUNTER — Other Ambulatory Visit (HOSPITAL_COMMUNITY): Payer: Self-pay

## 2023-07-15 ENCOUNTER — Other Ambulatory Visit: Payer: Self-pay | Admitting: Nurse Practitioner

## 2023-07-15 DIAGNOSIS — Z3041 Encounter for surveillance of contraceptive pills: Secondary | ICD-10-CM

## 2023-07-15 NOTE — Telephone Encounter (Signed)
 Med refill request:Loestrin Last AEX: 04/15/22 Next AEX:Not scheduled, message sent to scheduling department   Last MMG (if hormonal med)10/27/22 BI-RADS cat 1 neg Refill authorized: Last rx 05/19/23 #21 with 0 refills. Rx has been refused due to pt not having an appointment. Per Tiffany pt needs appointment to have any refills.

## 2023-07-16 ENCOUNTER — Other Ambulatory Visit (HOSPITAL_COMMUNITY): Payer: Self-pay

## 2023-07-25 ENCOUNTER — Other Ambulatory Visit: Payer: Self-pay

## 2023-07-25 ENCOUNTER — Other Ambulatory Visit: Payer: Self-pay | Admitting: Nurse Practitioner

## 2023-07-25 ENCOUNTER — Other Ambulatory Visit (HOSPITAL_COMMUNITY): Payer: Self-pay

## 2023-07-25 DIAGNOSIS — Z3041 Encounter for surveillance of contraceptive pills: Secondary | ICD-10-CM

## 2023-07-25 NOTE — Telephone Encounter (Signed)
 Med refill request: loestrin Last AEX: 04/15/22 Next AEX:not schedules, message sent to FD to call pt to schedule. Last MMG (if hormonal med)10/27/22 BI-RADS cat 1 neg  Refill authorized:  Last rx 05/19/23 #21 with 0 refills. Rx has been refused due to pt not having an appointment. Per Tiffany pt needs appointment to have any refills. Routing to provider for review.

## 2023-07-28 ENCOUNTER — Other Ambulatory Visit (HOSPITAL_COMMUNITY): Payer: Self-pay

## 2023-07-28 ENCOUNTER — Other Ambulatory Visit: Payer: Self-pay

## 2023-07-28 MED ORDER — CITALOPRAM HYDROBROMIDE 40 MG PO TABS
20.0000 mg | ORAL_TABLET | Freq: Every day | ORAL | 2 refills | Status: DC
Start: 2023-07-28 — End: 2023-11-23
  Filled 2023-07-28: qty 30, 30d supply, fill #0

## 2023-08-08 ENCOUNTER — Other Ambulatory Visit: Payer: Self-pay

## 2023-08-12 IMAGING — MG MM DIGITAL SCREENING BILAT W/ TOMO AND CAD
8 series · 9 of 24 positions shown · non-contrast
Comparison: Previous exam(s).

CLINICAL DATA: Screening.

EXAM:
DIGITAL SCREENING BILATERAL MAMMOGRAM WITH TOMOSYNTHESIS AND CAD
TECHNIQUE: Bilateral screening digital craniocaudal and mediolateral oblique
mammograms were obtained. Bilateral screening digital breast
tomosynthesis was performed. The images were evaluated with
computer-aided detection.

[R MLO synth-2D]
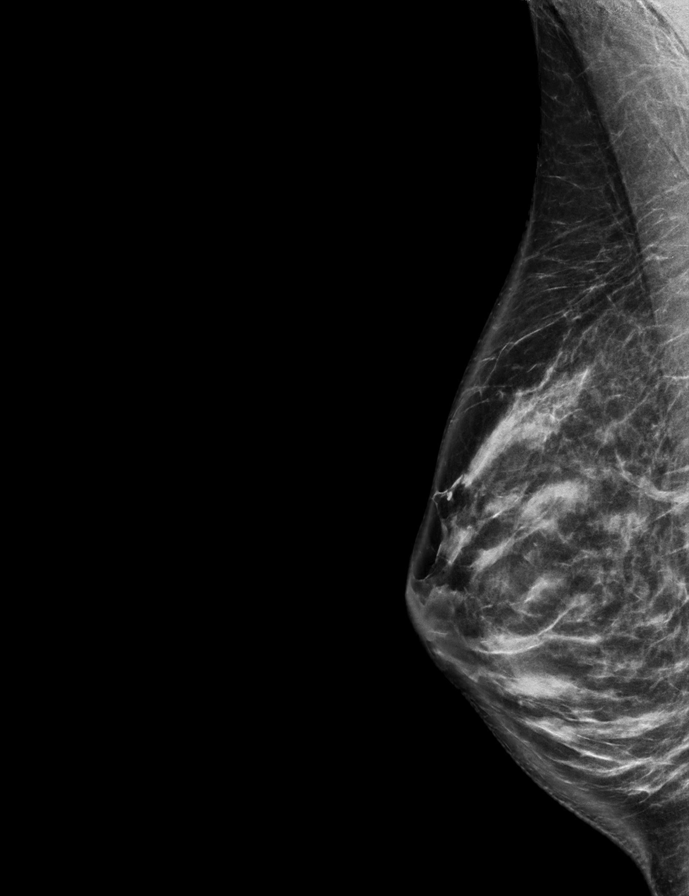

[L MLO synth-2D]
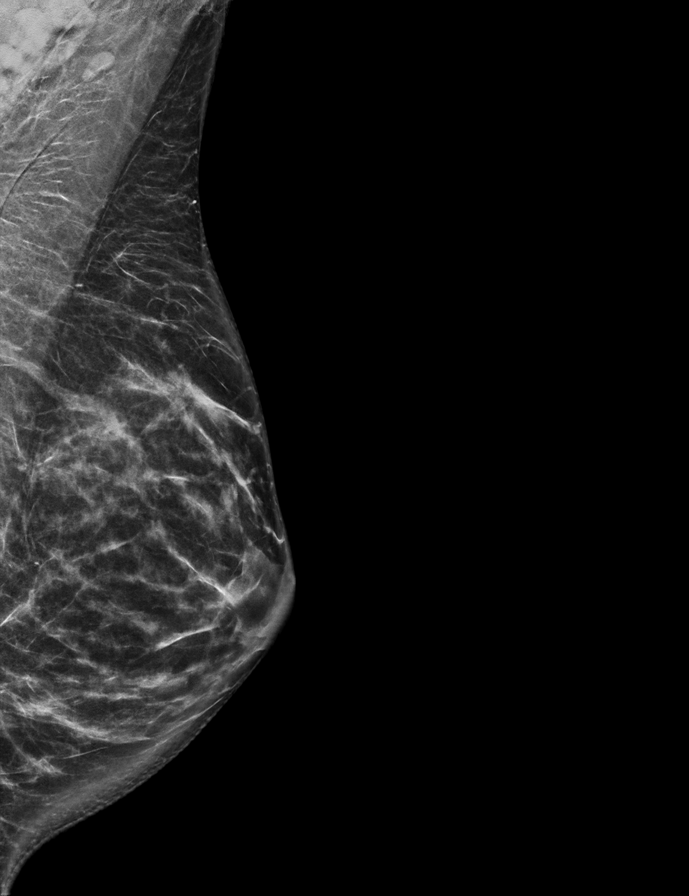

[L CC synth-2D]
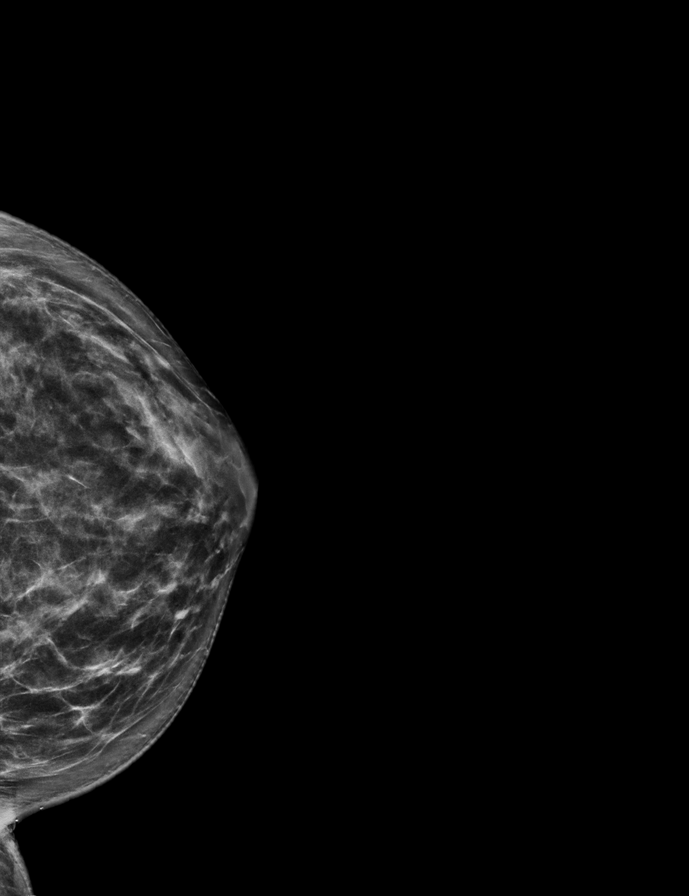

[R CC synth-2D]
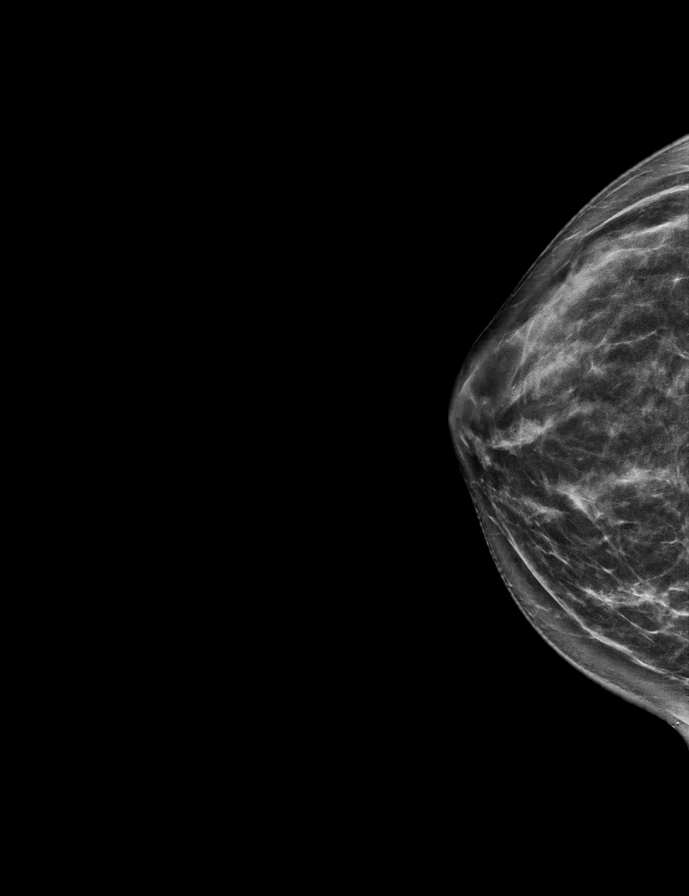

[R CC tomo · 2 of 71 frames shown]
[frame 23/71]
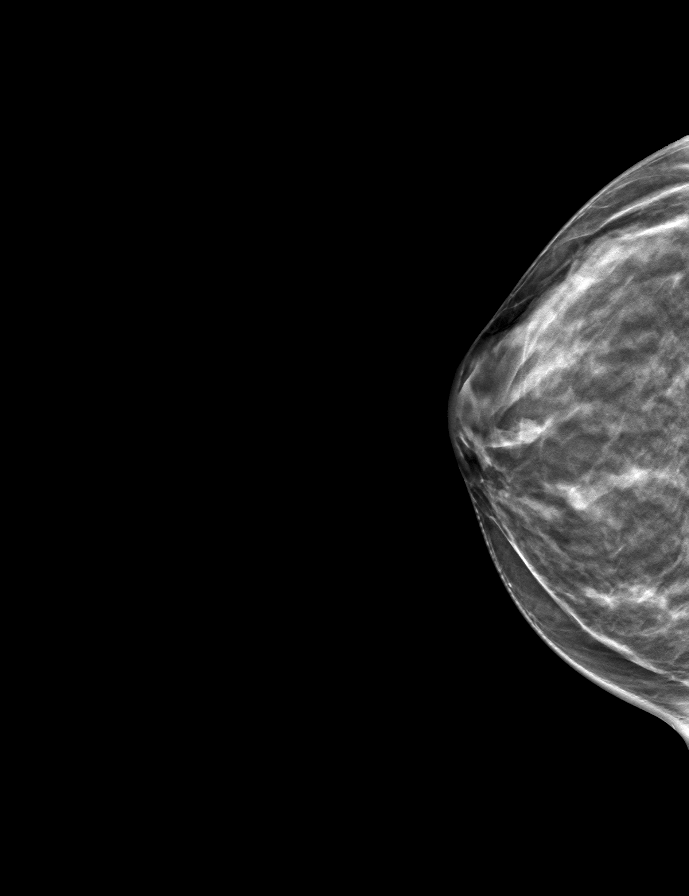
[frame 36/71]
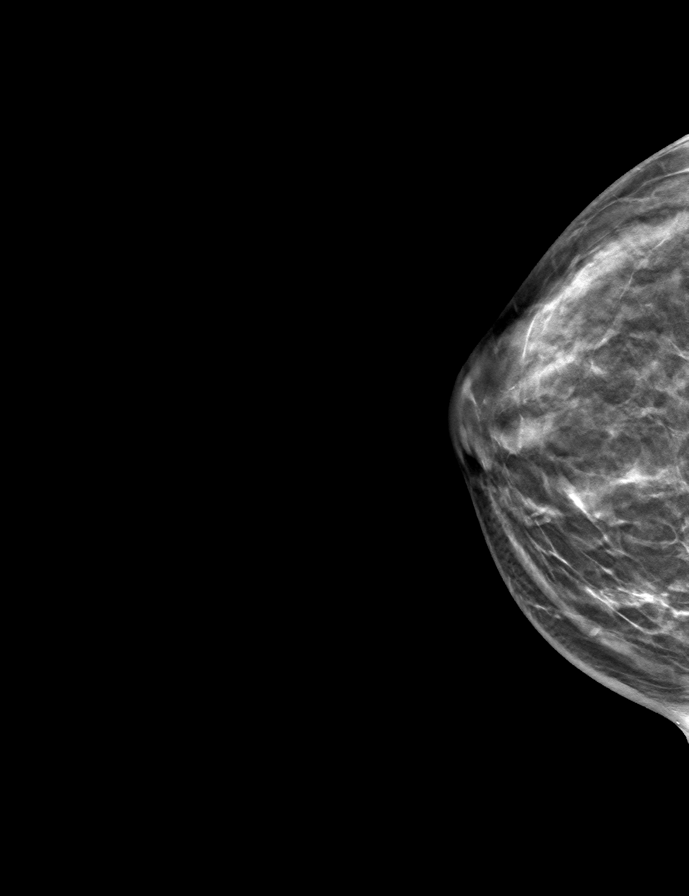

[R MLO tomo · tomo slice 35/70.0]
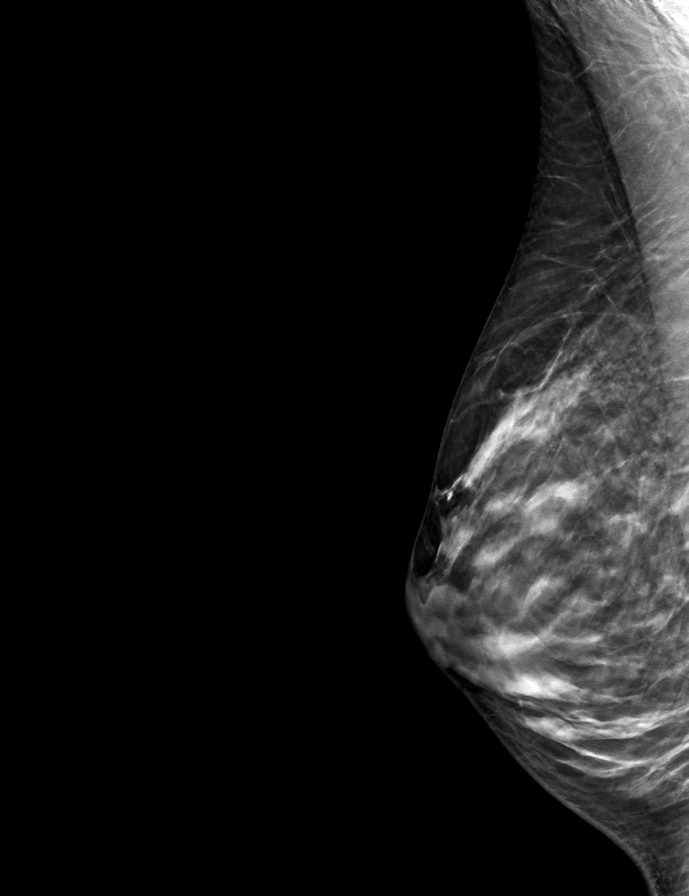

[L MLO tomo · tomo slice 33/66.0]
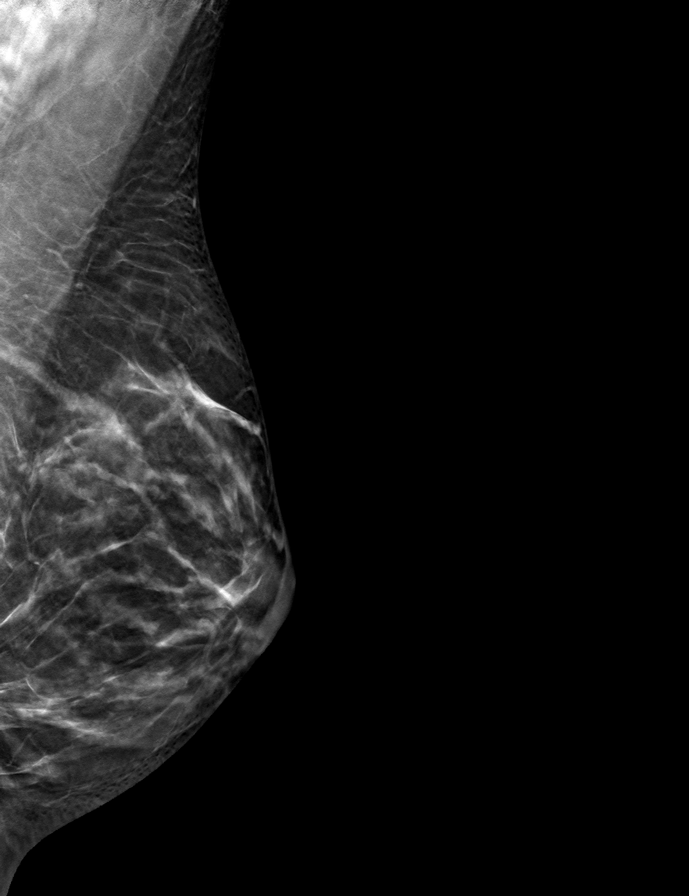

[L CC tomo · tomo slice 34/67.0]
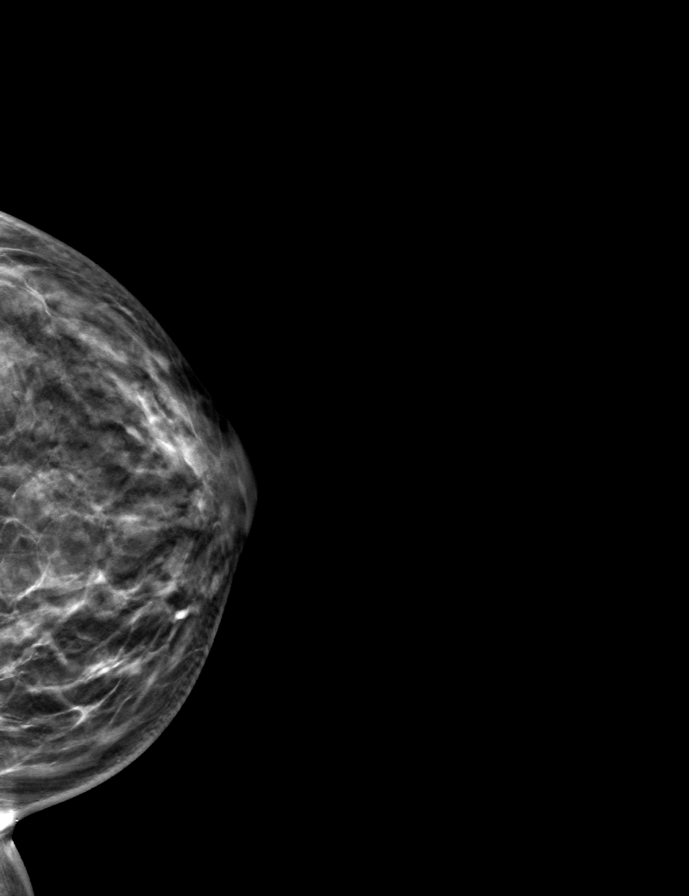

[9 of 24 positions shown; findings below may reference images not displayed]

ACR Breast Density Category c: The breast tissue is heterogeneously
dense, which may obscure small masses.
FINDINGS: There are no findings suspicious for malignancy.
IMPRESSION: No mammographic evidence of malignancy. A result letter of this
screening mammogram will be mailed directly to the patient.

RECOMMENDATION:
Screening mammogram in one year. (Code:Q3-W-BC3)

BI-RADS CATEGORY  1: Negative.

## 2023-08-17 DIAGNOSIS — E1065 Type 1 diabetes mellitus with hyperglycemia: Secondary | ICD-10-CM | POA: Diagnosis not present

## 2023-08-22 ENCOUNTER — Other Ambulatory Visit (HOSPITAL_COMMUNITY): Payer: Self-pay

## 2023-08-23 ENCOUNTER — Other Ambulatory Visit (HOSPITAL_COMMUNITY): Payer: Self-pay

## 2023-08-23 ENCOUNTER — Other Ambulatory Visit: Payer: Self-pay

## 2023-09-08 DIAGNOSIS — E109 Type 1 diabetes mellitus without complications: Secondary | ICD-10-CM | POA: Diagnosis not present

## 2023-09-21 ENCOUNTER — Other Ambulatory Visit (HOSPITAL_COMMUNITY): Payer: Self-pay

## 2023-09-21 ENCOUNTER — Other Ambulatory Visit: Payer: Self-pay

## 2023-09-30 ENCOUNTER — Other Ambulatory Visit: Payer: Self-pay

## 2023-09-30 DIAGNOSIS — Z3041 Encounter for surveillance of contraceptive pills: Secondary | ICD-10-CM

## 2023-09-30 NOTE — Addendum Note (Signed)
 Addended by: Frankie Scipio N on: 09/30/2023 03:52 PM   Modules accepted: Orders

## 2023-09-30 NOTE — Telephone Encounter (Signed)
 Patient left message on triage line advising AEX scheduled,  requesting refill.   Last AEX 04/15/22 ML Next AEX: 11/23/23 -[facility] MMG 10/27/22 -BiRads 1 neg

## 2023-09-30 NOTE — Telephone Encounter (Signed)
 Patient left message on RF line that she needed a refill on Loestrin  1/20, that she was due for a pap every 2 years.  VM left for patient on her cell # (ok per DPR) that she needed yearly exams (even if pap not due yearly).  Office number was left for her and also sent a staff message to our front desk to contact patient to schedule AEX.

## 2023-10-03 ENCOUNTER — Other Ambulatory Visit (HOSPITAL_COMMUNITY): Payer: Self-pay

## 2023-10-03 MED ORDER — NORETHINDRONE ACET-ETHINYL EST 1-20 MG-MCG PO TABS
1.0000 | ORAL_TABLET | Freq: Every day | ORAL | 1 refills | Status: DC
  Filled 2023-10-03: qty 21, 21d supply, fill #0
  Filled 2023-11-19: qty 21, 21d supply, fill #1

## 2023-10-04 ENCOUNTER — Other Ambulatory Visit: Payer: Self-pay | Admitting: Endocrinology

## 2023-10-04 DIAGNOSIS — Z1231 Encounter for screening mammogram for malignant neoplasm of breast: Secondary | ICD-10-CM

## 2023-10-19 ENCOUNTER — Other Ambulatory Visit (HOSPITAL_COMMUNITY): Payer: Self-pay

## 2023-10-23 ENCOUNTER — Other Ambulatory Visit: Payer: Self-pay | Admitting: Medical Genetics

## 2023-10-24 ENCOUNTER — Other Ambulatory Visit: Payer: Self-pay

## 2023-10-24 ENCOUNTER — Other Ambulatory Visit (HOSPITAL_COMMUNITY): Payer: Self-pay

## 2023-10-25 ENCOUNTER — Other Ambulatory Visit: Payer: Self-pay

## 2023-10-27 ENCOUNTER — Other Ambulatory Visit (HOSPITAL_COMMUNITY): Payer: Self-pay

## 2023-10-28 ENCOUNTER — Other Ambulatory Visit
Admission: RE | Admit: 2023-10-28 | Discharge: 2023-10-28 | Disposition: A | Discharge status: Home / Self Care | Payer: Self-pay | Source: Ambulatory Visit | Attending: Endocrinology | Admitting: Endocrinology

## 2023-10-28 ENCOUNTER — Ambulatory Visit
Admission: RE | Admit: 2023-10-28 | Discharge: 2023-10-28 | Disposition: A | Discharge status: Home / Self Care | Payer: Self-pay | Source: Ambulatory Visit | Attending: Endocrinology | Admitting: Endocrinology

## 2023-10-28 DIAGNOSIS — Z1231 Encounter for screening mammogram for malignant neoplasm of breast: Secondary | ICD-10-CM | POA: Insufficient documentation

## 2023-11-08 LAB — GENECONNECT MOLECULAR SCREEN: Genetic Analysis Overall Interpretation: NEGATIVE

## 2023-11-19 ENCOUNTER — Other Ambulatory Visit (HOSPITAL_COMMUNITY): Payer: Self-pay

## 2023-11-22 NOTE — Progress Notes (Signed)
 43 y.o. G23P2002 female here for annual exam. 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  Daughter 40 yo, son ~30 yo.  T2DM managed by Dr. Nichole, endo/PCP.  Patient's last menstrual period was 10/31/2023 (approximate). Period Duration (Days): 3 Period Pattern: Regular Menstrual Flow: Light Menstrual Control: Maxi pad, Tampon Dysmenorrhea: (!) Mild  She reports no other concerns today. Needs COC refill. Urine sample provided: No  Abnormal bleeding: none Pelvic discharge or pain: none Breast mass, nipple discharge or skin changes : none  Sexually active: Yes Birth control: OCP Last PAP:     Component Value Date/Time   DIAGPAP  04/15/2022 0912    - Negative for intraepithelial lesion or malignancy (NILM)   ADEQPAP  04/15/2022 0912    Satisfactory for evaluation; transformation zone component PRESENT.   Last mammogram: 10/28/23 Bi-Rads 1, Density C Last colonoscopy: never   Exercising: Yes, walking 4-5 times a week Smoker: No  Flowsheet Row Office Visit from 11/23/20[facility]  PHQ-2 Total Score 0      GYN HISTORY: 10/28/2023 Neg genetic testing   OB History  Gravida Para Term Preterm AB Living  2 2 2   2   SAB IAB Ectopic Multiple Live Births      2    # Outcome Date GA Lbr Len/2nd Weight Sex Type Anes PTL Lv  2 Term      Vag-Spont   LIV  1 Term      Vag-Spont   LIV   Past Medical History:  Diagnosis Date   Anxiety    Depression    Diabetes mellitus without complication (HCC)    type  1   Seasonal allergies    Past Surgical History:  Procedure Laterality Date   CARPAL TUNNEL WITH CUBITAL TUNNEL Right 05/05/2021   Procedure: Right carpal tunnel release.  Right in situ ulnar nerve release.;  Surgeon: Camella Fallow, MD;  Location: [facility] OR;  Service: Orthopedics;  Laterality: Right;   INGUINAL HERNIA REPAIR     WISDOM TOOTH EXTRACTION     Current Outpatient Medications on File Prior to Visit  Medication Sig Dispense Refill    ALPRAZolam  (XANAX ) 0.25 MG tablet TAKE 1 TABLET BY MOUTH TWO TIMES DAILY 60 tablet 3   Blood Glucose Monitoring Suppl (FREESTYLE LITE) w/Device KIT Use as directed to check blood glucose. 1 kit 0   citalopram  (CELEXA ) 10 MG tablet Take 2 tablets (20 mg total) by mouth daily. 180 tablet 4   Continuous Glucose Sensor (DEXCOM G7 SENSOR) MISC Change every 10 days to monitor blood glucose 10 each 3   doxycycline  (VIBRA -TABS) 100 MG tablet Take 1 tablet (100 mg total) by mouth 2 (two) times daily  as directed. (as needed for skin infections) 20 tablet 3   fexofenadine  (ALLEGRA ALLERGY) 180 MG tablet      glucose blood (FREESTYLE LITE) test strip Use to test sugar up to 8 times a day. 240 strip 5   insulin  lispro (HUMALOG ) 100 UNIT/ML injection Inject up to 0.5 mLs (50 Units total) into the skin daily via pump. 60 mL 2   methocarbamol  (ROBAXIN ) 750 MG tablet Take 1 tablet (750 mg total) by mouth Nightly. 30 tablet 12   ondansetron  (ZOFRAN -ODT) 8 MG disintegrating tablet Dissolve one tablet sublingual every 4 hours as needed for nausea for 30 days 60 tablet 1   spironolactone  (ALDACTONE ) 50 MG tablet Take 1 tablet (50 mg total) by mouth daily for acne 90 tablet 4   triamcinolone  cream (  KENALOG ) 0.1 % APPLY TO LEGS TWICE A DAY IF NEEDED FOR ITCHING 454 g 4   No current facility-administered medications on file prior to visit.   Social History   Socioeconomic History   Marital status: Married    Spouse name: Not on file   Number of children: Not on file   Years of education: Not on file   Highest education level: Not on file  Occupational History   Not on file  Tobacco Use   Smoking status: Never   Smokeless tobacco: Never  Vaping Use   Vaping status: Never Used  Substance and Sexual Activity   Alcohol use: Yes    Comment: OCC WINE   Drug use: Never   Sexual activity: Yes    Partners: Male    Birth control/protection: OCP    Comment: 1ST INTERCOURSE- 64, PARTNERS- 1   Other Topics  Concern   Not on file  Social History Narrative   Not on file   Social Drivers of Health   Financial Resource Strain: Not on file  Food Insecurity: Not on file  Transportation Needs: Not on file  Physical Activity: Not on file  Stress: Not on file  Social Connections: Not on file  Intimate Partner Violence: Not on file   Family History  Problem Relation Age of Onset   Other Mother        pcv (blood disorder)   Diabetes Maternal Uncle    Cancer Maternal Grandmother        melanoma    Hypertension Maternal Grandmother    Stroke Paternal Grandmother    Breast cancer Neg Hx    Allergies  Allergen Reactions   Augmentin [Amoxicillin-Pot Clavulanate] Anaphylaxis   Peanut-Containing Drug Products Anaphylaxis   Sulfa Antibiotics Anaphylaxis   Guaifenex La [Guaifenesin Er]     Heart palpitations     PE Today's Vitals   11/23/23 0806  BP: 104/60  Pulse: 79  Temp: 98.3 F (36.8 C)  TempSrc: Oral  SpO2: 97%  Weight: 129 lb (58.5 kg)  Height: 5' 4.5 (1.638 m)   Body mass index is 21.8 kg/m.  Physical Exam Vitals reviewed. Exam conducted with a chaperone present.  Constitutional:      General: She is not in acute distress.    Appearance: Normal appearance.  HENT:     Head: Normocephalic and atraumatic.     Nose: Nose normal.  Eyes:     Extraocular Movements: Extraocular movements intact.     Conjunctiva/sclera: Conjunctivae normal.  Neck:     Thyroid : No thyroid  mass, thyromegaly or thyroid  tenderness.  Pulmonary:     Effort: Pulmonary effort is normal.  Chest:     Chest wall: No mass or tenderness.  Breasts:    Right: Normal. No swelling, mass, nipple discharge, skin change or tenderness.     Left: Normal. No swelling, mass, nipple discharge, skin change or tenderness.  Abdominal:     General: There is no distension.     Palpations: Abdomen is soft.     Tenderness: There is no abdominal tenderness.  Genitourinary:    General: Normal vulva.     Exam  position: Lithotomy position.     Urethra: No prolapse.     Vagina: Normal. No vaginal discharge or bleeding.     Cervix: Normal. No lesion.     Uterus: Normal. Not enlarged and not tender.      Adnexa: Right adnexa normal and left adnexa normal.  Musculoskeletal:  General: Normal range of motion.     Cervical back: Normal range of motion.  Lymphadenopathy:     Upper Body:     Right upper body: No axillary adenopathy.     Left upper body: No axillary adenopathy.     Lower Body: No right inguinal adenopathy. No left inguinal adenopathy.  Skin:    General: Skin is warm and dry.  Neurological:     General: No focal deficit present.     Mental Status: She is alert.  Psychiatric:        Mood and Affect: Mood normal.        Behavior: Behavior normal.       Assessment and Plan:        Well woman exam with routine gynecological exam Assessment & Plan: Cervical cancer screening performed according to ASCCP guidelines. Encouraged annual mammogram screening Colonoscopy due next year  Labs and immunizations with her primary Encouraged safe sexual practices as indicated Encouraged healthy lifestyle practices with diet and exercise For patients under 50yo, I recommend 1000mg  calcium daily and 600IU of vitamin D daily.    Oral contraceptive pill surveillance  Negative depression screening  Encounter for surveillance of contraceptive pills -     Norethindrone  Acet-Ethinyl Est; Take 1 tablet by mouth daily. Please schedule appointment.  Dispense: 84 tablet; Refill: 4   Vera LULLA Pa, MD

## 2023-11-23 ENCOUNTER — Other Ambulatory Visit (HOSPITAL_COMMUNITY): Payer: Self-pay

## 2023-11-23 ENCOUNTER — Ambulatory Visit (INDEPENDENT_AMBULATORY_CARE_PROVIDER_SITE_OTHER): Admitting: Obstetrics and Gynecology

## 2023-11-23 ENCOUNTER — Encounter: Payer: Self-pay | Admitting: Obstetrics and Gynecology

## 2023-11-23 VITALS — BP 104/60 | HR 79 | Temp 98.3°F | Ht 64.5 in | Wt 129.0 lb

## 2023-11-23 DIAGNOSIS — Z3041 Encounter for surveillance of contraceptive pills: Secondary | ICD-10-CM | POA: Insufficient documentation

## 2023-11-23 DIAGNOSIS — Z01419 Encounter for gynecological examination (general) (routine) without abnormal findings: Secondary | ICD-10-CM | POA: Diagnosis not present

## 2023-11-23 DIAGNOSIS — Z1331 Encounter for screening for depression: Secondary | ICD-10-CM

## 2023-11-23 MED ORDER — NORETHINDRONE ACET-ETHINYL EST 1-20 MG-MCG PO TABS
1.0000 | ORAL_TABLET | Freq: Every day | ORAL | 4 refills | Status: AC
  Filled 2023-11-23 – 2023-12-13 (×3): qty 84, 84d supply, fill #0
  Filled 2024-03-13: qty 84, 84d supply, fill #1

## 2023-11-23 NOTE — Assessment & Plan Note (Signed)
 Cervical cancer screening performed according to ASCCP guidelines. Encouraged annual mammogram screening Colonoscopy due next year  Labs and immunizations with her primary Encouraged safe sexual practices as indicated Encouraged healthy lifestyle practices with diet and exercise For patients under 44yo, I recommend 1000mg  calcium daily and 600IU of vitamin D daily.

## 2023-11-23 NOTE — Patient Instructions (Signed)

## 2023-12-02 ENCOUNTER — Other Ambulatory Visit (HOSPITAL_COMMUNITY): Payer: Self-pay

## 2023-12-09 DIAGNOSIS — E1065 Type 1 diabetes mellitus with hyperglycemia: Secondary | ICD-10-CM | POA: Diagnosis not present

## 2023-12-09 DIAGNOSIS — E109 Type 1 diabetes mellitus without complications: Secondary | ICD-10-CM | POA: Diagnosis not present

## 2023-12-14 ENCOUNTER — Other Ambulatory Visit (HOSPITAL_COMMUNITY): Payer: Self-pay

## 2023-12-14 ENCOUNTER — Other Ambulatory Visit: Payer: Self-pay

## 2023-12-15 ENCOUNTER — Encounter: Payer: Self-pay | Admitting: Pharmacist

## 2023-12-15 ENCOUNTER — Other Ambulatory Visit: Payer: Self-pay

## 2023-12-15 ENCOUNTER — Other Ambulatory Visit (HOSPITAL_COMMUNITY): Payer: Self-pay

## 2023-12-19 ENCOUNTER — Encounter: Payer: Self-pay | Admitting: Pharmacist

## 2023-12-19 ENCOUNTER — Other Ambulatory Visit: Payer: Self-pay

## 2023-12-20 ENCOUNTER — Other Ambulatory Visit (HOSPITAL_COMMUNITY): Payer: Self-pay

## 2023-12-22 ENCOUNTER — Other Ambulatory Visit: Payer: Self-pay

## 2023-12-22 ENCOUNTER — Other Ambulatory Visit (HOSPITAL_BASED_OUTPATIENT_CLINIC_OR_DEPARTMENT_OTHER): Payer: Self-pay

## 2023-12-22 ENCOUNTER — Other Ambulatory Visit (HOSPITAL_COMMUNITY): Payer: Self-pay

## 2023-12-23 ENCOUNTER — Other Ambulatory Visit (HOSPITAL_COMMUNITY): Payer: Self-pay

## 2023-12-23 MED ORDER — FLUZONE 0.5 ML IM SUSY
0.5000 mL | PREFILLED_SYRINGE | Freq: Once | INTRAMUSCULAR | 0 refills | Status: AC
  Filled 2023-12-23: qty 0.5, 1d supply, fill #0

## 2023-12-27 ENCOUNTER — Other Ambulatory Visit (HOSPITAL_BASED_OUTPATIENT_CLINIC_OR_DEPARTMENT_OTHER): Payer: Self-pay

## 2023-12-27 MED ORDER — NITROFURANTOIN MONOHYD MACRO 100 MG PO CAPS
100.0000 mg | ORAL_CAPSULE | Freq: Two times a day (BID) | ORAL | 0 refills | Status: AC
  Filled 2023-12-27: qty 14, 7d supply, fill #0

## 2023-12-28 DIAGNOSIS — E310 Autoimmune polyglandular failure: Secondary | ICD-10-CM | POA: Diagnosis not present

## 2023-12-28 DIAGNOSIS — E109 Type 1 diabetes mellitus without complications: Secondary | ICD-10-CM | POA: Diagnosis not present

## 2024-01-02 ENCOUNTER — Ambulatory Visit (HOSPITAL_COMMUNITY)
Admission: RE | Admit: 2024-01-02 | Discharge: 2024-01-02 | Disposition: A | Discharge status: Home / Self Care | Source: Ambulatory Visit | Attending: Surgery | Admitting: Surgery

## 2024-01-02 ENCOUNTER — Other Ambulatory Visit (HOSPITAL_COMMUNITY): Payer: Self-pay | Admitting: Endocrinology

## 2024-01-02 DIAGNOSIS — I6529 Occlusion and stenosis of unspecified carotid artery: Secondary | ICD-10-CM | POA: Diagnosis not present

## 2024-01-16 ENCOUNTER — Other Ambulatory Visit: Payer: Self-pay

## 2024-01-17 ENCOUNTER — Other Ambulatory Visit: Payer: Self-pay

## 2024-02-15 ENCOUNTER — Other Ambulatory Visit (HOSPITAL_COMMUNITY): Payer: Self-pay

## 2024-02-23 ENCOUNTER — Other Ambulatory Visit: Payer: Self-pay

## 2024-02-23 ENCOUNTER — Other Ambulatory Visit (HOSPITAL_COMMUNITY): Payer: Self-pay

## 2024-02-23 MED ORDER — METHOCARBAMOL 750 MG PO TABS
ORAL_TABLET | ORAL | 12 refills | Status: AC
  Filled 2024-02-23: qty 30, 30d supply, fill #0
  Filled 2024-03-26: qty 30, 30d supply, fill #1
  Filled 2024-04-21: qty 30, 30d supply, fill #2

## 2024-02-23 MED ORDER — CITALOPRAM HYDROBROMIDE 10 MG PO TABS
20.0000 mg | ORAL_TABLET | Freq: Every day | ORAL | 4 refills | Status: AC
  Filled 2024-02-23: qty 180, 90d supply, fill #0

## 2024-03-02 ENCOUNTER — Other Ambulatory Visit (HOSPITAL_COMMUNITY): Payer: Self-pay

## 2024-03-13 ENCOUNTER — Other Ambulatory Visit (HOSPITAL_COMMUNITY): Payer: Self-pay

## 2024-03-13 ENCOUNTER — Other Ambulatory Visit: Payer: Self-pay

## 2024-03-15 ENCOUNTER — Other Ambulatory Visit: Payer: Self-pay

## 2024-03-15 ENCOUNTER — Other Ambulatory Visit (HOSPITAL_COMMUNITY): Payer: Self-pay

## 2024-03-15 ENCOUNTER — Encounter: Payer: Self-pay | Admitting: Pharmacist

## 2024-03-16 ENCOUNTER — Other Ambulatory Visit: Payer: Self-pay

## 2024-03-26 ENCOUNTER — Other Ambulatory Visit (HOSPITAL_COMMUNITY): Payer: Self-pay

## 2024-03-27 ENCOUNTER — Other Ambulatory Visit (HOSPITAL_COMMUNITY): Payer: Self-pay

## 2024-03-27 ENCOUNTER — Other Ambulatory Visit: Payer: Self-pay

## 2024-03-27 MED ORDER — INSULIN LISPRO 100 UNIT/ML IJ SOLN
50.0000 [IU] | Freq: Every day | INTRAMUSCULAR | 4 refills | Status: AC
  Filled 2024-03-27: qty 40, 80d supply, fill #0

## 2024-03-28 ENCOUNTER — Other Ambulatory Visit: Payer: Self-pay

## 2024-03-29 ENCOUNTER — Other Ambulatory Visit: Payer: Self-pay

## 2024-03-30 DIAGNOSIS — E1065 Type 1 diabetes mellitus with hyperglycemia: Secondary | ICD-10-CM | POA: Diagnosis not present

## 2024-04-13 ENCOUNTER — Other Ambulatory Visit (HOSPITAL_COMMUNITY): Payer: Self-pay

## 2024-04-13 ENCOUNTER — Other Ambulatory Visit: Payer: Self-pay

## 2024-04-13 MED ORDER — ONDANSETRON 8 MG PO TBDP
8.0000 mg | ORAL_TABLET | ORAL | 11 refills | Status: AC | PRN
  Filled 2024-04-13: qty 60, 10d supply, fill #0

## 2024-04-14 ENCOUNTER — Other Ambulatory Visit (HOSPITAL_COMMUNITY): Payer: Self-pay

## 2024-04-18 ENCOUNTER — Other Ambulatory Visit: Payer: Self-pay

## 2024-04-21 ENCOUNTER — Other Ambulatory Visit: Payer: Self-pay

## 2024-04-21 ENCOUNTER — Other Ambulatory Visit (HOSPITAL_COMMUNITY): Payer: Self-pay

## 2024-04-21 MED ORDER — CIPROFLOXACIN HCL 500 MG PO TABS
500.0000 mg | ORAL_TABLET | Freq: Two times a day (BID) | ORAL | 0 refills | Status: AC
  Filled 2024-04-21: qty 14, 7d supply, fill #0

## 2024-04-21 MED ORDER — HYDROXYZINE HCL 10 MG PO TABS
10.0000 mg | ORAL_TABLET | Freq: Two times a day (BID) | ORAL | 3 refills | Status: AC | PRN
  Filled 2024-04-21: qty 60, 30d supply, fill #0

## 2024-04-22 ENCOUNTER — Other Ambulatory Visit: Payer: Self-pay

## 2024-04-24 ENCOUNTER — Other Ambulatory Visit (HOSPITAL_BASED_OUTPATIENT_CLINIC_OR_DEPARTMENT_OTHER): Payer: Self-pay

## 2024-05-13 ENCOUNTER — Other Ambulatory Visit (HOSPITAL_COMMUNITY): Payer: Self-pay

## 2024-11-26 ENCOUNTER — Ambulatory Visit: Admitting: Obstetrics and Gynecology

## 2024-11-29 ENCOUNTER — Ambulatory Visit: Admitting: Obstetrics and Gynecology
